# Patient Record
Sex: Female | Born: 1960 | Race: White | Hispanic: No | Marital: Single | Smoking: Never smoker
Health system: Southern US, Community
[De-identification: ages and names within clinical notes are randomized; demographics above are authoritative.]

## PROBLEM LIST (undated history)

## (undated) DIAGNOSIS — G35 Multiple sclerosis: Secondary | ICD-10-CM

## (undated) DIAGNOSIS — G35D Multiple sclerosis, unspecified: Secondary | ICD-10-CM

## (undated) DIAGNOSIS — E669 Obesity, unspecified: Secondary | ICD-10-CM

## (undated) DIAGNOSIS — M169 Osteoarthritis of hip, unspecified: Secondary | ICD-10-CM

## (undated) DIAGNOSIS — G709 Myoneural disorder, unspecified: Secondary | ICD-10-CM

## (undated) HISTORY — DX: Osteoarthritis of hip, unspecified: M16.9

## (undated) HISTORY — PX: HERNIA REPAIR: SHX51

## (undated) HISTORY — DX: Myoneural disorder, unspecified: G70.9

## (undated) HISTORY — DX: Multiple sclerosis: G35

## (undated) HISTORY — DX: Multiple sclerosis, unspecified: G35.D

## (undated) HISTORY — PX: ABDOMINAL HYSTERECTOMY: SHX81

## (undated) HISTORY — DX: Obesity, unspecified: E66.9

---

## 1996-07-31 HISTORY — PX: OVARIAN CYST REMOVAL: SHX89

## 2013-06-30 ENCOUNTER — Encounter: Payer: Self-pay | Admitting: Physician Assistant

## 2013-06-30 ENCOUNTER — Ambulatory Visit (INDEPENDENT_AMBULATORY_CARE_PROVIDER_SITE_OTHER): Payer: BC Managed Care – PPO | Admitting: Physician Assistant

## 2013-06-30 VITALS — BP 175/99 | HR 96 | Resp 16 | Ht 66.0 in | Wt 270.0 lb

## 2013-06-30 DIAGNOSIS — L720 Epidermal cyst: Secondary | ICD-10-CM

## 2013-06-30 DIAGNOSIS — M545 Low back pain: Secondary | ICD-10-CM

## 2013-06-30 DIAGNOSIS — I10 Essential (primary) hypertension: Secondary | ICD-10-CM

## 2013-06-30 DIAGNOSIS — G35 Multiple sclerosis: Secondary | ICD-10-CM | POA: Insufficient documentation

## 2013-06-30 DIAGNOSIS — IMO0001 Reserved for inherently not codable concepts without codable children: Secondary | ICD-10-CM

## 2013-06-30 DIAGNOSIS — L723 Sebaceous cyst: Secondary | ICD-10-CM

## 2013-06-30 DIAGNOSIS — R030 Elevated blood-pressure reading, without diagnosis of hypertension: Secondary | ICD-10-CM | POA: Insufficient documentation

## 2013-06-30 MED ORDER — CYCLOBENZAPRINE HCL 10 MG PO TABS: 10.0000 mg | ORAL_TABLET | Freq: Three times a day (TID) | ORAL | Status: DC | PRN

## 2013-06-30 MED ORDER — PREDNISONE 50 MG PO TABS: ORAL_TABLET | ORAL | Status: DC

## 2013-06-30 NOTE — Progress Notes (Signed)
   Subjective:    Patient ID: Sandra Hartman, female    DOB: 06/16/1961, 52 y.o.   MRN: [id number]  HPI Patient is a 52 year old female who presents to the clinic to establish care. Patient does have a past medical history of multiple sclerosis. She has previously been on medications to help with tenderness however they were expensive and really did not help. Her symptoms typically manifest with balance, tinnitus, banding, and low back pain. She's had 2 MRIs that have confirmed MS in 2008 and 2011. She regularly takes ibuprofen and a multivitamin. She denies any exercise 2 to MS. She is having an episode of low back pain exacerbation today. Per patient typically steroids usually resolve back pain. She recently moved down here approximately 2 months ago from [place]. She has done a lot of heavy lifting and more activity than she is used to. She denies any saddle anesthesia, bowel/bladder dysfunction. Per patient she has had imaging done of her back and now has been negative other than some degenerative changes.   Patient's blood pressure is very elevated today. Patient is very adamant that she has whitecoat hypertension. She checks her blood pressure at home and per patient stays around 125/85. Patient denies any chest pains, palpitations headaches or vision changes.  Patient also has a small nodule on the back of her neck. Nodule is nontender and does not seem to be growing in size. It has been present for a few months now. Patient has not done anything to make better or anything to make worse.    Review of Systems  All other systems reviewed and are negative.       Objective:   Physical Exam  Constitutional: She is oriented to person, place, and time. She appears well-developed and well-nourished.  HENT:  Head: Normocephalic and atraumatic.  Cardiovascular: Normal rate, regular rhythm and normal heart sounds.   Pulmonary/Chest: Effort normal and breath sounds normal.  Musculoskeletal:   Tightness around Paraspinous muscles of lumbar spine. Negative straight leg test. ROM normal with some discomfort at the waist.    Neurological: She is alert and oriented to person, place, and time.  Skin:  1cm by 1 cm nodule on back of neck. No erythema. Small white head   Psychiatric: She has a normal mood and affect. Her behavior is normal.          Assessment & Plan:  Elevated BP/white coat hypertension- discuss with patient that I would like for her to keep a blood pressure log at home for the next 2 weeks and bring into the office to assure me that her blood pressure is staying within normal range.  MS/The back pain flare-encouraged patient to continue taking ibuprofen as needed. I did give a five-day burst of prednisone as well as a muscle relaxer to use at bedtime to help with the back muscles. Encouraged patient to do low back stretches. Call if not improving.   Epidermal inclusion cyst- I attempted excision today but not successful I was not able to get significant pus out of sac. I stopped for today and gave pt symptomatic care. If does not resolve come back for excision or we can refer to outside dermatologist.

## 2013-06-30 NOTE — Patient Instructions (Addendum)
Low Back Strain with Rehab A strain is an injury in which a tendon or muscle is torn. The muscles and tendons of the lower back are vulnerable to strains. However, these muscles and tendons are very strong and require a great force to be injured. Strains are classified into three categories. Grade 1 strains cause pain, but the tendon is not lengthened. Grade 2 strains include a lengthened ligament, due to the ligament being stretched or partially ruptured. With grade 2 strains there is still function, although the function may be decreased. Grade 3 strains involve a complete tear of the tendon or muscle, and function is usually impaired. SYMPTOMS   Pain in the lower back.  Pain that affects one side more than the other.  Pain that gets worse with movement and may be felt in the hip, buttocks, or back of the thigh.  Muscle spasms of the muscles in the back.  Swelling along the muscles of the back.  Loss of strength of the back muscles.  Crackling sound (crepitation) when the muscles are touched. CAUSES  Lower back strains occur when a force is placed on the muscles or tendons that is greater than they can handle. Common causes of injury include:  Prolonged overuse of the muscle-tendon units in the lower back, usually from incorrect posture.  A single violent injury or force applied to the back. RISK INCREASES WITH:  Sports that involve twisting forces on the spine or a lot of bending at the waist (football, rugby, weightlifting, bowling, golf, tennis, speed skating, racquetball, swimming, running, gymnastics, diving).  Poor strength and flexibility.  Failure to warm up properly before activity.  Family history of lower back pain or disk disorders.  Previous back injury or surgery (especially fusion).  Poor posture with lifting, especially heavy objects.  Prolonged sitting, especially with poor posture. PREVENTION   Learn and use proper posture when sitting or lifting (maintain  proper posture when sitting, lift using the knees and legs, not at the waist).  Warm up and stretch properly before activity.  Allow for adequate recovery between workouts.  Maintain physical fitness:  Strength, flexibility, and endurance.  Cardiovascular fitness. PROGNOSIS  If treated properly, lower back strains usually heal within 6 weeks. RELATED COMPLICATIONS   Recurring symptoms, resulting in a chronic problem.  Chronic inflammation, scarring, and partial muscle-tendon tear.  Delayed healing or resolution of symptoms.  Prolonged disability. TREATMENT  Treatment first involves the use of ice and medicine, to reduce pain and inflammation. The use of strengthening and stretching exercises may help reduce pain with activity. These exercises may be performed at home or with a therapist. Severe injuries may require referral to a therapist for further evaluation and treatment, such as ultrasound. Your caregiver may advise that you wear a back brace or corset, to help reduce pain and discomfort. Often, prolonged bed rest results in greater harm then benefit. Corticosteroid injections may be recommended. However, these should be reserved for the most serious cases. It is important to avoid using your back when lifting objects. At night, sleep on your back on a firm mattress with a pillow placed under your knees. If non-surgical treatment is unsuccessful, surgery may be needed.  MEDICATION   If pain medicine is needed, nonsteroidal anti-inflammatory medicines (aspirin and ibuprofen), or other minor pain relievers (acetaminophen), are often advised.  Do not take pain medicine for 7 days before surgery.  Prescription pain relievers may be given, if your caregiver thinks they are needed. Use only as   directed and only as much as you need.  Ointments applied to the skin may be helpful.  Corticosteroid injections may be given by your caregiver. These injections should be reserved for the most  serious cases, because they may only be given a certain number of times. HEAT AND COLD  Cold treatment (icing) should be applied for 10 to 15 minutes every 2 to 3 hours for inflammation and pain, and immediately after activity that aggravates your symptoms. Use ice packs or an ice massage.  Heat treatment may be used before performing stretching and strengthening activities prescribed by your caregiver, physical therapist, or athletic trainer. Use a heat pack or a warm water soak. SEEK MEDICAL CARE IF:   Symptoms get worse or do not improve in 2 to 4 weeks, despite treatment.  You develop numbness, weakness, or loss of bowel or bladder function.  New, unexplained symptoms develop. (Drugs used in treatment may produce side effects.) EXERCISES  RANGE OF MOTION (ROM) AND STRETCHING EXERCISES - Low Back Strain Most people with lower back pain will find that their symptoms get worse with excessive bending forward (flexion) or arching at the lower back (extension). The exercises which will help resolve your symptoms will focus on the opposite motion.  Your physician, physical therapist or athletic trainer will help you determine which exercises will be most helpful to resolve your lower back pain. Do not complete any exercises without first consulting with your caregiver. Discontinue any exercises which make your symptoms worse until you speak to your caregiver.  If you have pain, numbness or tingling which travels down into your buttocks, leg or foot, the goal of the therapy is for these symptoms to move closer to your back and eventually resolve. Sometimes, these leg symptoms will get better, but your lower back pain may worsen. This is typically an indication of progress in your rehabilitation. Be very alert to any changes in your symptoms and the activities in which you participated in the 24 hours prior to the change. Sharing this information with your caregiver will allow him/her to most efficiently  treat your condition.  These exercises may help you when beginning to rehabilitate your injury. Your symptoms may resolve with or without further involvement from your physician, physical therapist or athletic trainer. While completing these exercises, remember:  Restoring tissue flexibility helps normal motion to return to the joints. This allows healthier, less painful movement and activity.  An effective stretch should be held for at least 30 seconds.  A stretch should never be painful. You should only feel a gentle lengthening or release in the stretched tissue. FLEXION RANGE OF MOTION AND STRETCHING EXERCISES: STRETCH  Flexion, Single Knee to Chest   Lie on a firm bed or floor with both legs extended in front of you.  Keeping one leg in contact with the floor, bring your opposite knee to your chest. Hold your leg in place by either grabbing behind your thigh or at your knee.  Pull until you feel a gentle stretch in your lower back. Hold __________ seconds.  Slowly release your grasp and repeat the exercise with the opposite side. Repeat __________ times. Complete this exercise __________ times per day.  STRETCH  Flexion, Double Knee to Chest   Lie on a firm bed or floor with both legs extended in front of you.  Keeping one leg in contact with the floor, bring your opposite knee to your chest.  Tense your stomach muscles to support your back and then   lift your other knee to your chest. Hold your legs in place by either grabbing behind your thighs or at your knees.  Pull both knees toward your chest until you feel a gentle stretch in your lower back. Hold __________ seconds.  Tense your stomach muscles and slowly return one leg at a time to the floor. Repeat __________ times. Complete this exercise __________ times per day.  STRETCH  Low Trunk Rotation  Lie on a firm bed or floor. Keeping your legs in front of you, bend your knees so they are both pointed toward the ceiling and  your feet are flat on the floor.  Extend your arms out to the side. This will stabilize your upper body by keeping your shoulders in contact with the floor.  Gently and slowly drop both knees together to one side until you feel a gentle stretch in your lower back. Hold for __________ seconds.  Tense your stomach muscles to support your lower back as you bring your knees back to the starting position. Repeat the exercise to the other side. Repeat __________ times. Complete this exercise __________ times per day  EXTENSION RANGE OF MOTION AND FLEXIBILITY EXERCISES: STRETCH  Extension, Prone on Elbows   Lie on your stomach on the floor, a bed will be too soft. Place your palms about shoulder width apart and at the height of your head.  Place your elbows under your shoulders. If this is too painful, stack pillows under your chest.  Allow your body to relax so that your hips drop lower and make contact more completely with the floor.  Hold this position for __________ seconds.  Slowly return to lying flat on the floor. Repeat __________ times. Complete this exercise __________ times per day.  RANGE OF MOTION  Extension, Prone Press Ups  Lie on your stomach on the floor, a bed will be too soft. Place your palms about shoulder width apart and at the height of your head.  Keeping your back as relaxed as possible, slowly straighten your elbows while keeping your hips on the floor. You may adjust the placement of your hands to maximize your comfort. As you gain motion, your hands will come more underneath your shoulders.  Hold this position __________ seconds.  Slowly return to lying flat on the floor. Repeat __________ times. Complete this exercise __________ times per day.  RANGE OF MOTION- Quadruped, Neutral Spine   Assume a hands and knees position on a firm surface. Keep your hands under your shoulders and your knees under your hips. You may place padding under your knees for  comfort.  Drop your head and point your tail bone toward the ground below you. This will round out your lower back like an angry cat. Hold this position for __________ seconds.  Slowly lift your head and release your tail bone so that your back sags into a large arch, like an old horse.  Hold this position for __________ seconds.  Repeat this until you feel limber in your lower back.  Now, find your "sweet spot." This will be the most comfortable position somewhere between the two previous positions. This is your neutral spine. Once you have found this position, tense your stomach muscles to support your lower back.  Hold this position for __________ seconds. Repeat __________ times. Complete this exercise __________ times per day.  STRENGTHENING EXERCISES - Low Back Strain These exercises may help you when beginning to rehabilitate your injury. These exercises should be done near your "sweet   spot." This is the neutral, low-back arch, somewhere between fully rounded and fully arched, that is your least painful position. When performed in this safe range of motion, these exercises can be used for people who have either a flexion or extension based injury. These exercises may resolve your symptoms with or without further involvement from your physician, physical therapist or athletic trainer. While completing these exercises, remember:   Muscles can gain both the endurance and the strength needed for everyday activities through controlled exercises.  Complete these exercises as instructed by your physician, physical therapist or athletic trainer. Increase the resistance and repetitions only as guided.  You may experience muscle soreness or fatigue, but the pain or discomfort you are trying to eliminate should never worsen during these exercises. If this pain does worsen, stop and make certain you are following the directions exactly. If the pain is still present after adjustments, discontinue the  exercise until you can discuss the trouble with your caregiver. STRENGTHENING Deep Abdominals, Pelvic Tilt  Lie on a firm bed or floor. Keeping your legs in front of you, bend your knees so they are both pointed toward the ceiling and your feet are flat on the floor.  Tense your lower abdominal muscles to press your lower back into the floor. This motion will rotate your pelvis so that your tail bone is scooping upwards rather than pointing at your feet or into the floor.  With a gentle tension and even breathing, hold this position for __________ seconds. Repeat __________ times. Complete this exercise __________ times per day.  STRENGTHENING  Abdominals, Crunches   Lie on a firm bed or floor. Keeping your legs in front of you, bend your knees so they are both pointed toward the ceiling and your feet are flat on the floor. Cross your arms over your chest.  Slightly tip your chin down without bending your neck.  Tense your abdominals and slowly lift your trunk high enough to just clear your shoulder blades. Lifting higher can put excessive stress on the lower back and does not further strengthen your abdominal muscles.  Control your return to the starting position. Repeat __________ times. Complete this exercise __________ times per day.  STRENGTHENING  Quadruped, Opposite UE/LE Lift   Assume a hands and knees position on a firm surface. Keep your hands under your shoulders and your knees under your hips. You may place padding under your knees for comfort.  Find your neutral spine and gently tense your abdominal muscles so that you can maintain this position. Your shoulders and hips should form a rectangle that is parallel with the floor and is not twisted.  Keeping your trunk steady, lift your right hand no higher than your shoulder and then your left leg no higher than your hip. Make sure you are not holding your breath. Hold this position __________ seconds.  Continuing to keep your  abdominal muscles tense and your back steady, slowly return to your starting position. Repeat with the opposite arm and leg. Repeat __________ times. Complete this exercise __________ times per day.  STRENGTHENING  Lower Abdominals, Double Knee Lift  Lie on a firm bed or floor. Keeping your legs in front of you, bend your knees so they are both pointed toward the ceiling and your feet are flat on the floor.  Tense your abdominal muscles to brace your lower back and slowly lift both of your knees until they come over your hips. Be certain not to hold your breath.    Hold __________ seconds. Using your abdominal muscles, return to the starting position in a slow and controlled manner. Repeat __________ times. Complete this exercise __________ times per day.  POSTURE AND BODY MECHANICS CONSIDERATIONS - Low Back Strain Keeping correct posture when sitting, standing or completing your activities will reduce the stress put on different body tissues, allowing injured tissues a chance to heal and limiting painful experiences. The following are general guidelines for improved posture. Your physician or physical therapist will provide you with any instructions specific to your needs. While reading these guidelines, remember:  The exercises prescribed by your provider will help you have the flexibility and strength to maintain correct postures.  The correct posture provides the best environment for your joints to work. All of your joints have less wear and tear when properly supported by a spine with good posture. This means you will experience a healthier, less painful body.  Correct posture must be practiced with all of your activities, especially prolonged sitting and standing. Correct posture is as important when doing repetitive low-stress activities (typing) as it is when doing a single heavy-load activity (lifting). RESTING POSITIONS Consider which positions are most painful for you when choosing a  resting position. If you have pain with flexion-based activities (sitting, bending, stooping, squatting), choose a position that allows you to rest in a less flexed posture. You would want to avoid curling into a fetal position on your side. If your pain worsens with extension-based activities (prolonged standing, working overhead), avoid resting in an extended position such as sleeping on your stomach. Most people will find more comfort when they rest with their spine in a more neutral position, neither too rounded nor too arched. Lying on a non-sagging bed on your side with a pillow between your knees, or on your back with a pillow under your knees will often provide some relief. Keep in mind, being in any one position for a prolonged period of time, no matter how correct your posture, can still lead to stiffness. PROPER SITTING POSTURE In order to minimize stress and discomfort on your spine, you must sit with correct posture. Sitting with good posture should be effortless for a healthy body. Returning to good posture is a gradual process. Many people can work toward this most comfortably by using various supports until they have the flexibility and strength to maintain this posture on their own. When sitting with proper posture, your ears will fall over your shoulders and your shoulders will fall over your hips. You should use the back of the chair to support your upper back. Your lower back will be in a neutral position, just slightly arched. You may place a small pillow or folded towel at the base of your lower back for support.  When working at a desk, create an environment that supports good, upright posture. Without extra support, muscles tire, which leads to excessive strain on joints and other tissues. Keep these recommendations in mind: CHAIR:  A chair should be able to slide under your desk when your back makes contact with the back of the chair. This allows you to work closely.  The chair's  height should allow your eyes to be level with the upper part of your monitor and your hands to be slightly lower than your elbows. BODY POSITION  Your feet should make contact with the floor. If this is not possible, use a foot rest.  Keep your ears over your shoulders. This will reduce stress on your neck and   lower back. INCORRECT SITTING POSTURES  If you are feeling tired and unable to assume a healthy sitting posture, do not slouch or slump. This puts excessive strain on your back tissues, causing more damage and pain. Healthier options include:  Using more support, like a lumbar pillow.  Switching tasks to something that requires you to be upright or walking.  Talking a brief walk.  Lying down to rest in a neutral-spine position. PROLONGED STANDING WHILE SLIGHTLY LEANING FORWARD  When completing a task that requires you to lean forward while standing in one place for a long time, place either foot up on a stationary 2-4 inch high object to help maintain the best posture. When both feet are on the ground, the lower back tends to lose its slight inward curve. If this curve flattens (or becomes too large), then the back and your other joints will experience too much stress, tire more quickly, and can cause pain. CORRECT STANDING POSTURES Proper standing posture should be assumed with all daily activities, even if they only take a few moments, like when brushing your teeth. As in sitting, your ears should fall over your shoulders and your shoulders should fall over your hips. You should keep a slight tension in your abdominal muscles to brace your spine. Your tailbone should point down to the ground, not behind your body, resulting in an over-extended swayback posture.  INCORRECT STANDING POSTURES  Common incorrect standing postures include a forward head, locked knees and/or an excessive swayback. WALKING Walk with an upright posture. Your ears, shoulders and hips should all  line-up. PROLONGED ACTIVITY IN A FLEXED POSITION When completing a task that requires you to bend forward at your waist or lean over a low surface, try to find a way to stabilize 3 out of 4 of your limbs. You can place a hand or elbow on your thigh or rest a knee on the surface you are reaching across. This will provide you more stability so that your muscles do not fatigue as quickly. By keeping your knees relaxed, or slightly bent, you will also reduce stress across your lower back. CORRECT LIFTING TECHNIQUES DO :   Assume a wide stance. This will provide you more stability and the opportunity to get as close as possible to the object which you are lifting.  Tense your abdominals to brace your spine. Bend at the knees and hips. Keeping your back locked in a neutral-spine position, lift using your leg muscles. Lift with your legs, keeping your back straight.  Test the weight of unknown objects before attempting to lift them.  Try to keep your elbows locked down at your sides in order get the best strength from your shoulders when carrying an object.  Always ask for help when lifting heavy or awkward objects. INCORRECT LIFTING TECHNIQUES DO NOT:   Lock your knees when lifting, even if it is a small object.  Bend and twist. Pivot at your feet or move your feet when needing to change directions.  Assume that you can safely pick up even a paper clip without proper posture. Document Released: 07/17/2005 Document Revised: 10/09/2011 Document Reviewed: 10/29/2008 [facility] [place] Patient Information 2014 [place], [place].   Epidermal Cyst An epidermal cyst is sometimes called a sebaceous cyst, epidermal inclusion cyst, or infundibular cyst. These cysts usually contain a substance that looks "pasty" or "cheesy" and may have a bad smell. This substance is a protein called keratin. Epidermal cysts are usually found on the face, neck, or trunk. They  may also occur in the vaginal area or other parts of the  genitalia of both men and women. Epidermal cysts are usually small, painless, slow-growing bumps or lumps that move freely under the skin. It is important not to try to pop them. This may cause an infection and lead to tenderness and swelling. CAUSES  Epidermal cysts may be caused by a deep penetrating injury to the skin or a plugged hair follicle, often associated with acne. SYMPTOMS  Epidermal cysts can become inflamed and cause:  Redness.  Tenderness.  Increased temperature of the skin over the bumps or lumps.  Grayish-white, bad smelling material that drains from the bump or lump. DIAGNOSIS  Epidermal cysts are easily diagnosed by your caregiver during an exam. Rarely, a tissue sample (biopsy) may be taken to rule out other conditions that may resemble epidermal cysts. TREATMENT   Epidermal cysts often get better and disappear on their own. They are rarely ever cancerous.  If a cyst becomes infected, it may become inflamed and tender. This may require opening and draining the cyst. Treatment with antibiotics may be necessary. When the infection is gone, the cyst may be removed with minor surgery.  Small, inflamed cysts can often be treated with antibiotics or by injecting steroid medicines.  Sometimes, epidermal cysts become large and bothersome. If this happens, surgical removal in your caregiver's office may be necessary. HOME CARE INSTRUCTIONS  Only take over-the-counter or prescription medicines as directed by your caregiver.  Take your antibiotics as directed. Finish them even if you start to feel better. SEEK MEDICAL CARE IF:   Your cyst becomes tender, red, or swollen.  Your condition is not improving or is getting worse.  You have any other questions or concerns. MAKE SURE YOU:  Understand these instructions.  Will watch your condition.  Will get help right away if you are not doing well or get worse. Document Released: 06/17/2004 Document Revised: 10/09/2011  Document Reviewed: 01/23/2011 [facility] Patient Information 2014 [place], [place].

## 2013-07-03 ENCOUNTER — Encounter: Payer: Self-pay | Admitting: Obstetrics & Gynecology

## 2013-07-10 ENCOUNTER — Encounter: Payer: Self-pay | Admitting: Obstetrics & Gynecology

## 2013-07-10 ENCOUNTER — Ambulatory Visit (INDEPENDENT_AMBULATORY_CARE_PROVIDER_SITE_OTHER): Payer: BC Managed Care – PPO

## 2013-07-10 ENCOUNTER — Ambulatory Visit (INDEPENDENT_AMBULATORY_CARE_PROVIDER_SITE_OTHER): Payer: BC Managed Care – PPO | Admitting: Obstetrics & Gynecology

## 2013-07-10 VITALS — BP 161/106 | HR 127 | Resp 18 | Ht 66.0 in | Wt 267.0 lb

## 2013-07-10 DIAGNOSIS — Z01419 Encounter for gynecological examination (general) (routine) without abnormal findings: Secondary | ICD-10-CM

## 2013-07-10 DIAGNOSIS — N949 Unspecified condition associated with female genital organs and menstrual cycle: Secondary | ICD-10-CM

## 2013-07-10 DIAGNOSIS — Z124 Encounter for screening for malignant neoplasm of cervix: Secondary | ICD-10-CM

## 2013-07-10 DIAGNOSIS — Z0000 Encounter for general adult medical examination without abnormal findings: Secondary | ICD-10-CM

## 2013-07-10 DIAGNOSIS — Z1151 Encounter for screening for human papillomavirus (HPV): Secondary | ICD-10-CM

## 2013-07-10 DIAGNOSIS — Z1231 Encounter for screening mammogram for malignant neoplasm of breast: Secondary | ICD-10-CM

## 2013-07-10 DIAGNOSIS — N898 Other specified noninflammatory disorders of vagina: Secondary | ICD-10-CM

## 2013-07-10 NOTE — Progress Notes (Signed)
Subjective:    Sandra Hartman is a 51 y.o. female who presents for an annual exam. The patient is interested in weight loss. She needs a pap/mammogram. She mentions a constant vaginal discharge (clear) with an unusual/different odor. The patient is not currently sexually active. GYN screening history: last pap: was normal. The patient wears seatbelts: yes. The patient participates in regular exercise: no. Has the patient ever been transfused or tattooed?: no. The patient reports that there is not domestic violence in her life.   Menstrual History: OB History   Grav Para Term Preterm Abortions TAB SAB Ect Mult Living   0 0 0 0 0 0 0 0 0 0       Menarche age: 76 [place]: 48 LMP: 3/14 (:))  Patient's last menstrual period was 10/25/2012.    The following portions of the patient's history were reviewed and updated as appropriate: allergies, current medications, past family history, past medical history, past social history, past surgical history and problem list.  Review of Systems A comprehensive review of systems was negative. Single forever. Abstinent for years. Works for [employer], Research scientist (medical). Declines flu vaccine. She had fasting labs at work and cholesterol was 280. She gained 100 # after being diagnosed with MS in 2/09. She says that her sister is a Engineer, civil (consulting) and checks her BP at home and it is always normal.  BUT she also has elevated BP at doctor's offices.   Objective:    BP 161/106  Pulse 127  Resp 18  Ht 5\' 6"  (1.676 m)  Wt 267 lb (121.11 kg)  BMI 43.12 kg/m2  LMP 10/25/2012  General Appearance:    Alert, cooperative, no distress, appears stated age  Head:    Normocephalic, without obvious abnormality, atraumatic  Eyes:    PERRL, conjunctiva/corneas clear, EOM's intact, fundi    benign, both eyes  Ears:    Normal TM's and external ear canals, both ears  Nose:   Nares normal, septum midline, mucosa normal, no drainage    or sinus tenderness  Throat:   Lips,  mucosa, and tongue normal; teeth and gums normal  Neck:   Supple, symmetrical, trachea midline, no adenopathy;    thyroid:  no enlargement/tenderness/nodules; no carotid   bruit or JVD  Back:     Symmetric, no curvature, ROM normal, no CVA tenderness  Lungs:     Clear to auscultation bilaterally, respirations unlabored  Chest Wall:    No tenderness or deformity   Heart:    Regular rate and rhythm, S1 and S2 normal, no murmur, rub   or gallop  Breast Exam:    No tenderness, masses, or nipple abnormality  Abdomen:     Soft, non-tender, bowel sounds active all four quadrants,    no masses, no organomegaly  Genitalia:    Normal female without lesion, discharge or tenderness, no odor, scant clear discharge of vagina, NSSR, NT, mobile, no adnexal masses     Extremities:   Extremities normal, atraumatic, no cyanosis or edema  Pulses:   2+ and symmetric all extremities  Skin:   Skin color, texture, turgor normal, no rashes or lesions  Lymph nodes:   Cervical, supraclavicular, and axillary nodes normal  Neurologic:   CNII-XII intact, normal strength, sensation and reflexes    throughout  .    Assessment:    Healthy female exam.    Plan:     Breast self exam technique reviewed and patient encouraged to perform self-exam monthly. Mammogram. Thin prep Pap  smear. Wet prep.  costesting Referral to bariatric center

## 2013-07-10 NOTE — Patient Instructions (Signed)

## 2013-07-11 LAB — WET PREP, GENITAL: Yeast Wet Prep HPF POC: NONE SEEN

## 2013-08-12 ENCOUNTER — Encounter: Payer: BC Managed Care – PPO | Admitting: Obstetrics & Gynecology

## 2013-10-03 ENCOUNTER — Ambulatory Visit (INDEPENDENT_AMBULATORY_CARE_PROVIDER_SITE_OTHER): Payer: BC Managed Care – PPO | Admitting: Physician Assistant

## 2013-10-03 ENCOUNTER — Encounter: Payer: Self-pay | Admitting: Physician Assistant

## 2013-10-03 VITALS — BP 175/91 | HR 107 | Temp 97.6°F | Wt 266.0 lb

## 2013-10-03 DIAGNOSIS — I10 Essential (primary) hypertension: Secondary | ICD-10-CM

## 2013-10-03 DIAGNOSIS — IMO0001 Reserved for inherently not codable concepts without codable children: Secondary | ICD-10-CM

## 2013-10-03 DIAGNOSIS — H8110 Benign paroxysmal vertigo, unspecified ear: Secondary | ICD-10-CM

## 2013-10-03 DIAGNOSIS — R030 Elevated blood-pressure reading, without diagnosis of hypertension: Secondary | ICD-10-CM

## 2013-10-03 DIAGNOSIS — G35 Multiple sclerosis: Secondary | ICD-10-CM

## 2013-10-03 DIAGNOSIS — R51 Headache: Secondary | ICD-10-CM

## 2013-10-03 MED ORDER — FLUTICASONE PROPIONATE 50 MCG/ACT NA SUSP: 2.0000 | Freq: Every day | NASAL | Status: DC

## 2013-10-03 MED ORDER — KETOROLAC TROMETHAMINE 30 MG/ML IJ SOLN
30.0000 mg | Freq: Once | INTRAMUSCULAR | Status: AC
  Administered 2013-10-03: 30 mg via INTRAMUSCULAR

## 2013-10-03 MED ORDER — MECLIZINE HCL 25 MG PO TABS: 25.0000 mg | ORAL_TABLET | Freq: Three times a day (TID) | ORAL | Status: DC | PRN

## 2013-10-03 NOTE — Patient Instructions (Signed)
1-2 months BP log.

## 2013-10-03 NOTE — Progress Notes (Signed)
   Subjective:    Patient ID: Sandra Hartman, female    DOB: 1961/07/18, 53 y.o.   MRN: [id number]  HPI Pt is a 52 yo female who presents to the clinic with headache and dizzines since Monday almost 5 days. She woke up Monday morning and felt like room was spinning but seemed to get better through Tuesday. Headache is off and on and present behind eyes. Denies any vision changes or episodes of blindness. She denies any ear pain or sinus congestion/pressure. No fever, ST, cough, wheezing. She does ache between her shoulder blades. She feels naseated but no vomiting. She has tried ibuprofen and muscle relaxer's which have not helped very much. Symptoms are worse with movement and better with lying still.    Review of Systems     Objective:   Physical Exam  Constitutional: She appears well-developed and well-nourished.  HENT:  Head: Normocephalic and atraumatic.  Right Ear: External ear normal.  Left Ear: External ear normal.  Nose: Nose normal.  Mouth/Throat: Oropharynx is clear and moist.  TM's injected bilaterally.   Eyes: Conjunctivae and EOM are normal. Pupils are equal, round, and reactive to light. Right eye exhibits no discharge. Left eye exhibits no discharge.  Neck: Normal range of motion. Neck supple.  Cardiovascular: Regular rhythm and normal heart sounds.   Tachycardia at 107.   Pulmonary/Chest: Effort normal and breath sounds normal. She has no wheezes.  Lymphadenopathy:    She has no cervical adenopathy.  Neurological:  Positive Dix Hallipike to the the right.   Skin: Skin is dry.  Psychiatric: She has a normal mood and affect. Her behavior is normal.          Assessment & Plan:  BPV/headache-HA could be a MS reaction or from BPV. treated with in office shot of toradol 30mg  for headache. Gave epley manuevers explained how to do. Antivert was given for nausea and dizziness. Flonase to help nasal congestion and perhaps allow any fluid on TM's to drain. Continue to use  ibuprofen and flexeril. Call if worsening or not improving.   MS/Elevated BP/White coat HTN- pt always has high reading when she comes into office. This worries me because we don't know what she is running on a daily basis. Pt was instructed to keep log for 2 weeks up to three BP readings a day. i want to sit down and compare BP readings and make sure we don't need to start treating BP.

## 2014-04-07 ENCOUNTER — Encounter: Payer: BC Managed Care – PPO | Admitting: Physician Assistant

## 2014-04-08 ENCOUNTER — Ambulatory Visit (INDEPENDENT_AMBULATORY_CARE_PROVIDER_SITE_OTHER): Payer: BC Managed Care – PPO | Admitting: Physician Assistant

## 2014-04-08 ENCOUNTER — Encounter: Payer: Self-pay | Admitting: Physician Assistant

## 2014-04-08 VITALS — BP 190/94 | HR 88 | Ht 66.0 in | Wt 266.0 lb

## 2014-04-08 DIAGNOSIS — G35D Multiple sclerosis, unspecified: Secondary | ICD-10-CM | POA: Diagnosis not present

## 2014-04-08 DIAGNOSIS — Z0000 Encounter for general adult medical examination without abnormal findings: Secondary | ICD-10-CM

## 2014-04-08 DIAGNOSIS — G35 Multiple sclerosis: Secondary | ICD-10-CM | POA: Diagnosis not present

## 2014-04-08 DIAGNOSIS — IMO0001 Reserved for inherently not codable concepts without codable children: Secondary | ICD-10-CM

## 2014-04-08 DIAGNOSIS — R030 Elevated blood-pressure reading, without diagnosis of hypertension: Secondary | ICD-10-CM

## 2014-04-08 MED ORDER — CYCLOBENZAPRINE HCL 10 MG PO TABS: 10.0000 mg | ORAL_TABLET | Freq: Three times a day (TID) | ORAL | Status: DC | PRN

## 2014-04-08 MED ORDER — MELOXICAM 15 MG PO TABS: 15.0000 mg | ORAL_TABLET | Freq: Every day | ORAL | Status: DC

## 2014-04-08 MED ORDER — TRAMADOL HCL 50 MG PO TABS: 50.0000 mg | ORAL_TABLET | Freq: Two times a day (BID) | ORAL | Status: DC | PRN

## 2014-04-08 NOTE — Patient Instructions (Signed)

## 2014-04-11 NOTE — Progress Notes (Signed)
  Subjective:     Sandra Hartman is a 53 y.o. female and is here for a comprehensive physical exam. The patient reports no problems.  MS- fairly controlled taking ibuprofen daily. Would like something to help with breakthrough pain. Ibuprofen works fairly well.   History   Social History  . Marital Status: Married    Spouse Name: N/A    Number of Children: N/A  . Years of Education: N/A   Occupational History  . Not on file.   Social History Main Topics  . Smoking status: Never Smoker   . Smokeless tobacco: Never Used  . Alcohol Use: No  . Drug Use: No  . Sexual Activity: Not Currently   Other Topics Concern  . Not on file   Social History Narrative  . No narrative on file   Health Maintenance  Topic Date Due  . Tetanus/tdap  01/09/1979  . Colonoscopy  01/08/2010  . Influenza Vaccine  04/09/2015 (Originally 02/28/2014)  . Mammogram  07/11/2015  . Pap Smear  07/10/2016    The following portions of the patient's history were reviewed and updated as appropriate: allergies, current medications, past family history, past medical history, past social history, past surgical history and problem list.  Review of Systems A comprehensive review of systems was negative.   Objective:    BP 190/94  Pulse 88  Ht 5\' 6"  (1.676 m)  Wt 266 lb (120.657 kg)  BMI 42.95 kg/m2 General appearance: alert, cooperative and moderately obese Head: Normocephalic, without obvious abnormality, atraumatic Eyes: conjunctivae/corneas clear. PERRL, EOM's intact. Fundi benign. Ears: normal TM's and external ear canals both ears Nose: Nares normal. Septum midline. Mucosa normal. No drainage or sinus tenderness. Throat: lips, mucosa, and tongue normal; teeth and gums normal Neck: no adenopathy, no carotid bruit, no JVD, supple, symmetrical, trachea midline and thyroid not enlarged, symmetric, no tenderness/mass/nodules Back: symmetric, no curvature. ROM normal. No CVA tenderness. Lungs: clear to  auscultation bilaterally Breasts: normal appearance, no masses or tenderness Heart: regular rate and rhythm, S1, S2 normal, no murmur, click, rub or gallop Abdomen: soft, non-tender; bowel sounds normal; no masses,  no organomegaly Pelvic: cervix normal in appearance, external genitalia normal, no adnexal masses or tenderness, no cervical motion tenderness, uterus normal size, shape, and consistency and vagina normal without discharge Extremities: extremities normal, atraumatic, no cyanosis or edema Pulses: 2+ and symmetric Skin: Skin color, texture, turgor normal. No rashes or lesions Lymph nodes: Cervical, supraclavicular, and axillary nodes normal. Neurologic: Grossly normal    Assessment:    Healthy female exam.       Plan:    CPE- labs done at work. Will fax copy. All within normal limits. Declined colonoscopy. Due for mammogram and scheduled. Declined tetanus. HO given. Encouraged regular exercise and to work on weight loss.   White coat hypertension- bP taken at work and was 130/80 and passed qualifications. Always high at doctors office. Denies any HA's, CP, palpitations.   MS- taking a lot of motrin. Switched to mobic to try. Discussed GI side effects. Tramadol as needed given for acute pain.  See After Visit Summary for Counseling Recommendations

## 2014-07-08 ENCOUNTER — Other Ambulatory Visit: Payer: Self-pay | Admitting: Physician Assistant

## 2014-07-08 ENCOUNTER — Encounter: Payer: Self-pay | Admitting: Physician Assistant

## 2014-07-08 MED ORDER — PREDNISONE 50 MG PO TABS: ORAL_TABLET | ORAL | Status: DC

## 2014-09-02 ENCOUNTER — Encounter: Payer: Self-pay | Admitting: Physician Assistant

## 2014-09-02 ENCOUNTER — Ambulatory Visit (INDEPENDENT_AMBULATORY_CARE_PROVIDER_SITE_OTHER): Payer: BLUE CROSS/BLUE SHIELD | Admitting: Physician Assistant

## 2014-09-02 VITALS — BP 175/91 | HR 100 | Wt 273.0 lb

## 2014-09-02 DIAGNOSIS — F411 Generalized anxiety disorder: Secondary | ICD-10-CM | POA: Insufficient documentation

## 2014-09-02 DIAGNOSIS — R253 Fasciculation: Secondary | ICD-10-CM

## 2014-09-02 DIAGNOSIS — G35 Multiple sclerosis: Secondary | ICD-10-CM

## 2014-09-02 DIAGNOSIS — R0789 Other chest pain: Secondary | ICD-10-CM

## 2014-09-02 MED ORDER — LORAZEPAM 0.5 MG PO TABS: 0.5000 mg | ORAL_TABLET | Freq: Two times a day (BID) | ORAL | Status: DC | PRN

## 2014-09-02 NOTE — Progress Notes (Signed)
   Subjective:    Patient ID: Sandra Hartman, female    DOB: Feb 27, 1961, 54 y.o.   MRN: [id number]  HPI  Pt presents to the clinic with atypical squeezing pain of chest and "fluttering feeling of epigastric area". She had ongoing "banding that extends from ribcage down through abdomen" that she has had since dx of MS. What is new is fluttering and causes her to feel very anxious. Denies any CP, sweating, pain after eating, acid reflux, tingling down arms, back pain. No changes in bowel movements. When chest flutters feels like she has to cough. BP running 125/82 at home. Motrin does help. Has anxiety but not treating. Not tried anything else to make better.    Review of Systems  All other systems reviewed and are negative.      Objective:   Physical Exam  Constitutional: She is oriented to person, place, and time. She appears well-developed and well-nourished.  HENT:  Head: Normocephalic and atraumatic.  Cardiovascular: Normal rate, regular rhythm and normal heart sounds.   Pulmonary/Chest: Effort normal and breath sounds normal. She has no wheezes.  Abdominal: Soft. Bowel sounds are normal. She exhibits no distension and no mass. There is no rebound and no guarding.  Tenderness to deep palpation over bottom of sternum.   Neurological: She is alert and oriented to person, place, and time.  Skin: Skin is dry.  Psychiatric: She has a normal mood and affect. Her behavior is normal.          Assessment & Plan:  White coat hypertension- per pt checking at home and running 125/82. She is always nervous when she comes here.   Pressure/banding pain/muscle fluttering-  EKG NSR at 83, no ST elevation or depression, no PVCs or arrhymias.  Will order stress test. Sounds like combination of anxiety and MS flare could be a possiblity of PVC but not showing up on EKG. Pt reports these are similar symptoms she had when diagnosed with MS except for fluttering.  Did give flexeril and mobic to try.   Can also use ativian for fluttering muscle and as anxiety increases.  Red flag symptoms were discussed.

## 2014-09-02 NOTE — Patient Instructions (Addendum)
Will order stress test.  Gave pt some flexeril to use as needed.  Also given some ativan to try to see if can help with muscle relaxation as well as anxiety.

## 2014-09-06 DIAGNOSIS — R0789 Other chest pain: Secondary | ICD-10-CM | POA: Insufficient documentation

## 2014-09-15 ENCOUNTER — Ambulatory Visit: Payer: BLUE CROSS/BLUE SHIELD | Admitting: Obstetrics & Gynecology

## 2014-09-23 NOTE — Addendum Note (Signed)
Addended by: Narda Rutherford on: 09/23/2014 07:24 AM   Modules accepted: Orders

## 2014-09-24 ENCOUNTER — Other Ambulatory Visit: Payer: Self-pay | Admitting: Obstetrics & Gynecology

## 2014-09-24 DIAGNOSIS — Z139 Encounter for screening, unspecified: Secondary | ICD-10-CM

## 2014-10-21 ENCOUNTER — Encounter: Payer: Self-pay | Admitting: Obstetrics & Gynecology

## 2014-10-21 ENCOUNTER — Ambulatory Visit (INDEPENDENT_AMBULATORY_CARE_PROVIDER_SITE_OTHER): Payer: BLUE CROSS/BLUE SHIELD

## 2014-10-21 ENCOUNTER — Ambulatory Visit (INDEPENDENT_AMBULATORY_CARE_PROVIDER_SITE_OTHER): Payer: BLUE CROSS/BLUE SHIELD | Admitting: Obstetrics & Gynecology

## 2014-10-21 VITALS — BP 165/90 | HR 96 | Resp 16 | Ht 66.0 in | Wt 270.0 lb

## 2014-10-21 DIAGNOSIS — Z1151 Encounter for screening for human papillomavirus (HPV): Secondary | ICD-10-CM | POA: Diagnosis not present

## 2014-10-21 DIAGNOSIS — Z01419 Encounter for gynecological examination (general) (routine) without abnormal findings: Secondary | ICD-10-CM

## 2014-10-21 DIAGNOSIS — Z124 Encounter for screening for malignant neoplasm of cervix: Secondary | ICD-10-CM

## 2014-10-21 DIAGNOSIS — E669 Obesity, unspecified: Secondary | ICD-10-CM | POA: Insufficient documentation

## 2014-10-21 DIAGNOSIS — Z1231 Encounter for screening mammogram for malignant neoplasm of breast: Secondary | ICD-10-CM | POA: Diagnosis not present

## 2014-10-21 DIAGNOSIS — Z139 Encounter for screening, unspecified: Secondary | ICD-10-CM

## 2014-10-21 DIAGNOSIS — Z0000 Encounter for general adult medical examination without abnormal findings: Secondary | ICD-10-CM

## 2014-10-21 NOTE — Progress Notes (Signed)
Subjective:    Sandra Hartman is a 54 y.o. SW P0 female who presents for an annual exam. The patient has no complaints today. She had severe pain on the right side a couple of weeks ago. It resolved. She had a similar pain in the past with ovarian cysts. The patient is not currently sexually active. GYN screening history: last pap: was normal. The patient wears seatbelts: yes. The patient participates in regular exercise: "sometimes". Has the patient ever been transfused or tattooed?: no. The patient reports that there is not domestic violence in her life.   Menstrual History: OB History    Gravida Para Term Preterm AB TAB SAB Ectopic Multiple Living   0 0 0 0 0 0 0 0 0 0       Menarche age: 41  Patient's last menstrual period was 10/25/2012.    The following portions of the patient's history were reviewed and updated as appropriate: allergies, current medications, past family history, past medical history, past social history, past surgical history and problem list.  Review of Systems A comprehensive review of systems was negative. Working at [employer]. Declines flu vaccine. Mammogram today. LMP about 09/2012. She is aware that colonoscopy is due.   Objective:    BP 165/90 mmHg  Pulse 96  Resp 16  Ht 5\' 6"  (1.676 m)  Wt 270 lb (122.471 kg)  BMI 43.60 kg/m2  LMP 10/25/2012  General Appearance:    Alert, cooperative, no distress, appears stated age  Head:    Normocephalic, without obvious abnormality, atraumatic  Eyes:    PERRL, conjunctiva/corneas clear, EOM's intact, fundi    benign, both eyes  Ears:    Normal TM's and external ear canals, both ears  Nose:   Nares normal, septum midline, mucosa normal, no drainage    or sinus tenderness  Throat:   Lips, mucosa, and tongue normal; teeth and gums normal  Neck:   Supple, symmetrical, trachea midline, no adenopathy;    thyroid:  no enlargement/tenderness/nodules; no carotid   bruit or JVD  Back:      Symmetric, no curvature, ROM normal, no CVA tenderness  Lungs:     Clear to auscultation bilaterally, respirations unlabored  Chest Wall:    No tenderness or deformity   Heart:    Regular rate and rhythm, S1 and S2 normal, no murmur, rub   or gallop  Breast Exam:    No tenderness, masses, or nipple abnormality  Abdomen:     Soft, non-tender, bowel sounds active all four quadrants,    no masses, no organomegaly, obese  Genitalia:    Normal female without lesion, discharge or tenderness, paucity of pubic hair, ULN size uterus, mobile, NT, no pelvic masses felt     Extremities:   Extremities normal, atraumatic, no cyanosis or edema  Pulses:   2+ and symmetric all extremities  Skin:   Skin color, texture, turgor normal, no rashes or lesions  Lymph nodes:   Cervical, supraclavicular, and axillary nodes normal  Neurologic:   CNII-XII intact, normal strength, sensation and reflexes    throughout  .    Assessment:    Healthy female exam.   Right pelvic pain and her worries about gyn cancer. (She is telling me that she wants a hysterectomy. I have told her that a hysterectomy is not done for preventative purposes).   Plan:     Breast self exam technique reviewed and patient encouraged to perform self-exam monthly. Thin prep Pap smear.  Gyn u/s She declines fasting labs. Says she knows her cholesterol is high, declines referral.

## 2014-10-22 LAB — CYTOLOGY - PAP

## 2014-10-26 ENCOUNTER — Ambulatory Visit (INDEPENDENT_AMBULATORY_CARE_PROVIDER_SITE_OTHER): Payer: BLUE CROSS/BLUE SHIELD

## 2014-10-26 DIAGNOSIS — N958 Other specified menopausal and perimenopausal disorders: Secondary | ICD-10-CM | POA: Diagnosis not present

## 2014-10-26 DIAGNOSIS — D252 Subserosal leiomyoma of uterus: Secondary | ICD-10-CM

## 2014-10-26 DIAGNOSIS — Z0000 Encounter for general adult medical examination without abnormal findings: Secondary | ICD-10-CM

## 2014-10-27 ENCOUNTER — Telehealth: Payer: Self-pay | Admitting: *Deleted

## 2014-10-27 DIAGNOSIS — Z01812 Encounter for preprocedural laboratory examination: Secondary | ICD-10-CM

## 2014-10-27 MED ORDER — MISOPROSTOL 200 MCG PO TABS: ORAL_TABLET | ORAL | Status: DC

## 2014-10-27 NOTE — Telephone Encounter (Signed)
-----   Message from Emily Filbert, MD sent at 10/27/2014  3:55 PM EDT ----- She will need a EMBX. And can you please call in cytotec 600 mcg by mouth the night prior to the biopsy. Thanks

## 2014-10-29 ENCOUNTER — Ambulatory Visit (INDEPENDENT_AMBULATORY_CARE_PROVIDER_SITE_OTHER): Payer: BLUE CROSS/BLUE SHIELD | Admitting: Obstetrics & Gynecology

## 2014-10-29 ENCOUNTER — Other Ambulatory Visit: Payer: Self-pay | Admitting: Obstetrics & Gynecology

## 2014-10-29 ENCOUNTER — Encounter: Payer: Self-pay | Admitting: Obstetrics & Gynecology

## 2014-10-29 VITALS — BP 179/115 | HR 93 | Resp 16 | Ht 66.0 in | Wt 270.0 lb

## 2014-10-29 DIAGNOSIS — R102 Pelvic and perineal pain: Secondary | ICD-10-CM

## 2014-10-29 DIAGNOSIS — R938 Abnormal findings on diagnostic imaging of other specified body structures: Secondary | ICD-10-CM | POA: Diagnosis not present

## 2014-10-29 DIAGNOSIS — R9389 Abnormal findings on diagnostic imaging of other specified body structures: Secondary | ICD-10-CM

## 2014-10-29 NOTE — Progress Notes (Signed)
   Subjective:    Patient ID: Sandra Hartman, female    DOB: Mar 18, 1961, 54 y.o.   MRN: [id number]  HPI  This pleasant morbidly obese 54 yo WW is here today for a EMBX. She was seen recently for pelvic pain. I ordered an u/s that showed very small ovaries but a 15 mm endometrium. She denies PMB. She took cytotec last night.  Review of Systems     Objective:   Physical Exam   UPT negative, consent signed, time out done Cervix prepped with betadine and grasped with a single tooth tenaculum Uterus sounded to 7 cm Pipelle used for 2 passes with a moderate amount of blood obtained. She tolerated the procedure well.  I am not sure if the cells I got were endocervical or endometrial.       Assessment & Plan:  Thickened post menopausal endometium- await pathol. If only endocervical cells, then she will need a d&c.  Pelvic pain and h/o no colonoscopy- refer to GI for colonoscopy

## 2014-11-02 LAB — PATHOLOGY

## 2014-11-05 ENCOUNTER — Telehealth: Payer: Self-pay | Admitting: *Deleted

## 2014-11-05 DIAGNOSIS — N920 Excessive and frequent menstruation with regular cycle: Secondary | ICD-10-CM

## 2014-11-05 MED ORDER — MEGESTROL ACETATE 40 MG PO TABS: 40.0000 mg | ORAL_TABLET | Freq: Every day | ORAL | Status: DC

## 2014-11-05 NOTE — Telephone Encounter (Signed)
Pt called stating that she is bleeding and has been ,since taking Cytotec prior to her Endometrial biopsy.  She states that she is changing pads every hour with clots.  Spoke with Dr Hulan Fray who prescribed Megace 40 mg daily and appt made to discuss path report next week.

## 2014-11-12 ENCOUNTER — Ambulatory Visit (INDEPENDENT_AMBULATORY_CARE_PROVIDER_SITE_OTHER): Payer: BLUE CROSS/BLUE SHIELD | Admitting: Obstetrics & Gynecology

## 2014-11-12 ENCOUNTER — Encounter: Payer: Self-pay | Admitting: Obstetrics & Gynecology

## 2014-11-12 VITALS — BP 169/101 | HR 100 | Resp 16

## 2014-11-12 DIAGNOSIS — N920 Excessive and frequent menstruation with regular cycle: Secondary | ICD-10-CM

## 2014-11-12 NOTE — Progress Notes (Signed)
   Subjective:    Patient ID: Sandra Hartman, female    DOB: 19-Aug-1960, 54 y.o.   MRN: [id number]  HPI 54 yo lovely lady who is here to discuss her EMBX results. They were normal. She is still wanting a hysterectomy/BSO due to her pelvic pain and fears of gyn cancer in the future and her FH (mother had cancer "everywhere" but may have started in the gyn organs, mGM with ?uterine cancer)   Review of Systems     Objective:   Physical Exam        Assessment & Plan:  Reassurance given. I have explained that my philosophy is only to remove organs when there is a clear medical indication. I have recommeded a referral to another gyn.

## 2014-12-02 ENCOUNTER — Encounter: Payer: Self-pay | Admitting: Physician Assistant

## 2014-12-02 ENCOUNTER — Ambulatory Visit (INDEPENDENT_AMBULATORY_CARE_PROVIDER_SITE_OTHER): Payer: BLUE CROSS/BLUE SHIELD | Admitting: Physician Assistant

## 2014-12-02 VITALS — BP 181/116 | HR 92 | Wt 261.0 lb

## 2014-12-02 DIAGNOSIS — R938 Abnormal findings on diagnostic imaging of other specified body structures: Secondary | ICD-10-CM

## 2014-12-02 DIAGNOSIS — R9389 Abnormal findings on diagnostic imaging of other specified body structures: Secondary | ICD-10-CM

## 2014-12-02 DIAGNOSIS — IMO0001 Reserved for inherently not codable concepts without codable children: Secondary | ICD-10-CM

## 2014-12-02 DIAGNOSIS — R1012 Left upper quadrant pain: Secondary | ICD-10-CM

## 2014-12-02 DIAGNOSIS — R030 Elevated blood-pressure reading, without diagnosis of hypertension: Secondary | ICD-10-CM | POA: Diagnosis not present

## 2014-12-02 DIAGNOSIS — R1013 Epigastric pain: Secondary | ICD-10-CM | POA: Diagnosis not present

## 2014-12-02 DIAGNOSIS — R1031 Right lower quadrant pain: Secondary | ICD-10-CM | POA: Diagnosis not present

## 2014-12-02 DIAGNOSIS — R1032 Left lower quadrant pain: Secondary | ICD-10-CM

## 2014-12-02 DIAGNOSIS — Z8049 Family history of malignant neoplasm of other genital organs: Secondary | ICD-10-CM

## 2014-12-02 MED ORDER — OMEPRAZOLE 40 MG PO CPDR: 40.0000 mg | DELAYED_RELEASE_CAPSULE | Freq: Every day | ORAL | Status: DC

## 2014-12-02 NOTE — Patient Instructions (Signed)
Consider miralax 1 capful at bedtime to help bowel move.  Consider probiotic daily.  Will order CT scan of abdomen and pelvis.  Get another referral to GYN for 2nd opinion.  Start omeprazole 40mg  daily to see if helps with upper abdominal pain.  Stop and NSAIDS

## 2014-12-02 NOTE — Progress Notes (Signed)
   Subjective:    Patient ID: Sandra Hartman, female    DOB: January 01, 1961, 54 y.o.   MRN: [id number]  HPI  Pt presents to the clinic with abdominal pain. She has been blaming on MS since february. She was also having some bilateral lower abdominal pain and bleeding. Endometrium was thickened but per GYN biopsy negative. Family hx of uterine cancer sisters have already had hysterectomy. GYN would not do hysterectomy and would like a 2nd opinion.   She is most concerned today with upper abdominal pain. More painful over epigastric and left upper quadrant. Does she to be some worse after eating but feels it most of the time. No vomiting but does feel nauseated. Not tried anythingto make better. Bowel movement harder most of the time but this is her hx. No blood in stool has colonoscopy on Saturday.   Review of Systems  All other systems reviewed and are negative.      Objective:   Physical Exam  Constitutional: She is oriented to person, place, and time. She appears well-developed and well-nourished.  Obese.   HENT:  Head: Normocephalic and atraumatic.  Cardiovascular: Normal rate, regular rhythm and normal heart sounds.   Pulmonary/Chest: Effort normal and breath sounds normal.  Abdominal: Soft. Bowel sounds are normal.  Diffuse tenderness over entire upper abdomen more over epigastric and LUQ.  No guarding or rebound.   Tenderness to palpation over bilateral lower quadrant.   Neurological: She is alert and oriented to person, place, and time.  Skin: Skin is dry.  Psychiatric: She has a normal mood and affect. Her behavior is normal.          Assessment & Plan:  LUQ pain/upper abdominal pain- will get lipase, cmp. Would like to try priloece to see if any acid reflux as the cause. Not as suspicious for gallbladder etiology but pt has had pain since febuarly and would like to get CT scan.   Bilateral lower abdominal pain/thickened endometrium/family hx of uterine cancer/irregular  bleeding- will get 2nd opinion since pt is concerned. Family hx of uterine cancer and sisters have all had hysterectomy. For lower abdominal pain. U/s showed no cause. Need to consider constipation. Consider miralax and probiotic. Pt has colonoscopy on Monday which should help to clean her out and see if some of her pain is reduced.   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 She declines any medication. i request she fax over a BP log to keep on file. BP is very elevated today by our reading.

## 2014-12-03 LAB — COMPLETE METABOLIC PANEL WITH GFR
ALBUMIN: 4.6 g/dL (ref 3.5–5.2)
ALT: 29 U/L (ref 0–35)
AST: 18 U/L (ref 0–37)
Alkaline Phosphatase: 53 U/L (ref 39–117)
BUN: 8 mg/dL (ref 6–23)
CO2: 18 mEq/L — ABNORMAL LOW (ref 19–32)
Calcium: 9.6 mg/dL (ref 8.4–10.5)
Chloride: 106 mEq/L (ref 96–112)
Creat: 0.76 mg/dL (ref 0.50–1.10)
GFR, Est African American: >89 mL/min
GFR, Est Non African American: 89 mL/min
Glucose, Bld: 84 mg/dL (ref 70–99)
POTASSIUM: 4.1 meq/L (ref 3.5–5.3)
SODIUM: 139 meq/L (ref 135–145)
Total Bilirubin: 0.7 mg/dL (ref 0.2–1.2)
Total Protein: 7.6 g/dL (ref 6.0–8.3)

## 2014-12-03 LAB — CBC WITH DIFFERENTIAL/PLATELET
BASOS ABS: 0 10*3/uL (ref 0.0–0.1)
BASOS PCT: 0 % (ref 0–1)
EOS ABS: 0.2 10*3/uL (ref 0.0–0.7)
EOS PCT: 2 % (ref 0–5)
HCT: 43.3 % (ref 36.0–46.0)
Hemoglobin: 15.3 g/dL — ABNORMAL HIGH (ref 12.0–15.0)
Lymphocytes Relative: 33 % (ref 12–46)
Lymphs Abs: 2.7 10*3/uL (ref 0.7–4.0)
MCH: 32.3 pg (ref 26.0–34.0)
MCHC: 35.3 g/dL (ref 30.0–36.0)
MCV: 91.5 fL (ref 78.0–100.0)
MONOS PCT: 5 % (ref 3–12)
MPV: 11.3 fL (ref 8.6–12.4)
Monocytes Absolute: 0.4 10*3/uL (ref 0.1–1.0)
NEUTROS ABS: 4.9 10*3/uL (ref 1.7–7.7)
Neutrophils Relative %: 60 % (ref 43–77)
PLATELETS: 300 10*3/uL (ref 150–400)
RBC: 4.73 MIL/uL (ref 3.87–5.11)
RDW: 12.8 % (ref 11.5–15.5)
WBC: 8.1 10*3/uL (ref 4.0–10.5)

## 2014-12-03 LAB — LIPASE: Lipase: 15 U/L (ref 0–75)

## 2014-12-05 DIAGNOSIS — Z8049 Family history of malignant neoplasm of other genital organs: Secondary | ICD-10-CM | POA: Insufficient documentation

## 2014-12-05 DIAGNOSIS — R9389 Abnormal findings on diagnostic imaging of other specified body structures: Secondary | ICD-10-CM | POA: Insufficient documentation

## 2014-12-05 DIAGNOSIS — R1013 Epigastric pain: Secondary | ICD-10-CM | POA: Insufficient documentation

## 2014-12-05 DIAGNOSIS — R1012 Left upper quadrant pain: Secondary | ICD-10-CM | POA: Insufficient documentation

## 2014-12-05 DIAGNOSIS — R1031 Right lower quadrant pain: Secondary | ICD-10-CM | POA: Insufficient documentation

## 2014-12-05 DIAGNOSIS — R1032 Left lower quadrant pain: Secondary | ICD-10-CM

## 2014-12-08 ENCOUNTER — Encounter: Payer: Self-pay | Admitting: Physician Assistant

## 2014-12-08 DIAGNOSIS — K635 Polyp of colon: Secondary | ICD-10-CM | POA: Insufficient documentation

## 2014-12-09 ENCOUNTER — Encounter: Payer: Self-pay | Admitting: Physician Assistant

## 2014-12-09 ENCOUNTER — Telehealth: Payer: Self-pay

## 2014-12-09 ENCOUNTER — Other Ambulatory Visit: Payer: Self-pay | Admitting: Physician Assistant

## 2014-12-09 DIAGNOSIS — N920 Excessive and frequent menstruation with regular cycle: Secondary | ICD-10-CM

## 2014-12-09 MED ORDER — MEGESTROL ACETATE 40 MG PO TABS: 40.0000 mg | ORAL_TABLET | Freq: Every day | ORAL | Status: DC

## 2014-12-09 NOTE — Telephone Encounter (Signed)
Please follow up on gyn referral to female for 2nd opinion on hysterectomy and family hx.

## 2014-12-09 NOTE — Telephone Encounter (Signed)
Left message for patient to call office to schedule appt. Received referral form primary care.

## 2014-12-10 ENCOUNTER — Other Ambulatory Visit: Payer: Self-pay | Admitting: Physician Assistant

## 2014-12-10 ENCOUNTER — Ambulatory Visit (INDEPENDENT_AMBULATORY_CARE_PROVIDER_SITE_OTHER): Payer: BLUE CROSS/BLUE SHIELD

## 2014-12-10 DIAGNOSIS — D259 Leiomyoma of uterus, unspecified: Secondary | ICD-10-CM | POA: Diagnosis not present

## 2014-12-10 DIAGNOSIS — R9389 Abnormal findings on diagnostic imaging of other specified body structures: Secondary | ICD-10-CM

## 2014-12-10 DIAGNOSIS — R1031 Right lower quadrant pain: Secondary | ICD-10-CM

## 2014-12-10 DIAGNOSIS — R1012 Left upper quadrant pain: Secondary | ICD-10-CM

## 2014-12-10 DIAGNOSIS — M4807 Spinal stenosis, lumbosacral region: Secondary | ICD-10-CM | POA: Diagnosis not present

## 2014-12-10 DIAGNOSIS — R1032 Left lower quadrant pain: Secondary | ICD-10-CM

## 2014-12-10 DIAGNOSIS — Z8049 Family history of malignant neoplasm of other genital organs: Secondary | ICD-10-CM

## 2014-12-10 DIAGNOSIS — R1013 Epigastric pain: Secondary | ICD-10-CM

## 2014-12-10 MED ORDER — IOHEXOL 300 MG/ML  SOLN
100.0000 mL | Freq: Once | INTRAMUSCULAR | Status: AC | PRN
  Administered 2014-12-10: 100 mL via INTRAVENOUS

## 2014-12-24 ENCOUNTER — Telehealth: Payer: Self-pay | Admitting: *Deleted

## 2014-12-24 DIAGNOSIS — Z8049 Family history of malignant neoplasm of other genital organs: Secondary | ICD-10-CM

## 2014-12-24 DIAGNOSIS — R9389 Abnormal findings on diagnostic imaging of other specified body structures: Secondary | ICD-10-CM

## 2014-12-24 NOTE — Telephone Encounter (Signed)
New OBGYN referral placed for a 2nd opinion.

## 2015-01-03 ENCOUNTER — Other Ambulatory Visit: Payer: Self-pay | Admitting: Physician Assistant

## 2015-04-12 ENCOUNTER — Encounter: Payer: Self-pay | Admitting: Physician Assistant

## 2015-04-12 DIAGNOSIS — N8502 Endometrial intraepithelial neoplasia [EIN]: Secondary | ICD-10-CM | POA: Insufficient documentation

## 2015-04-26 ENCOUNTER — Ambulatory Visit (INDEPENDENT_AMBULATORY_CARE_PROVIDER_SITE_OTHER): Payer: BLUE CROSS/BLUE SHIELD | Admitting: Physician Assistant

## 2015-04-26 ENCOUNTER — Encounter: Payer: Self-pay | Admitting: Physician Assistant

## 2015-04-26 VITALS — BP 202/81 | HR 107 | Ht 66.0 in | Wt 250.0 lb

## 2015-04-26 DIAGNOSIS — G35 Multiple sclerosis: Secondary | ICD-10-CM | POA: Diagnosis not present

## 2015-04-26 DIAGNOSIS — R030 Elevated blood-pressure reading, without diagnosis of hypertension: Secondary | ICD-10-CM

## 2015-04-26 DIAGNOSIS — Z87440 Personal history of urinary (tract) infections: Secondary | ICD-10-CM | POA: Diagnosis not present

## 2015-04-26 DIAGNOSIS — IMO0001 Reserved for inherently not codable concepts without codable children: Secondary | ICD-10-CM

## 2015-04-26 DIAGNOSIS — R319 Hematuria, unspecified: Secondary | ICD-10-CM

## 2015-04-26 DIAGNOSIS — R1012 Left upper quadrant pain: Secondary | ICD-10-CM | POA: Diagnosis not present

## 2015-04-26 DIAGNOSIS — N3001 Acute cystitis with hematuria: Secondary | ICD-10-CM

## 2015-04-26 LAB — POCT URINALYSIS DIPSTICK
Bilirubin, UA: NEGATIVE
Glucose, UA: NEGATIVE
Ketones, UA: NEGATIVE
NITRITE UA: NEGATIVE
PH UA: 6
Protein, UA: NEGATIVE
Spec Grav, UA: 1.01
UROBILINOGEN UA: 0.2

## 2015-04-26 MED ORDER — CIPROFLOXACIN HCL 500 MG PO TABS: 500.0000 mg | ORAL_TABLET | Freq: Two times a day (BID) | ORAL | Status: DC

## 2015-04-26 MED ORDER — PREDNISONE 50 MG PO TABS: ORAL_TABLET | ORAL | Status: DC

## 2015-04-26 NOTE — Patient Instructions (Signed)
Hiatal Hernia A hiatal hernia occurs when part of your stomach slides above the muscle that separates your abdomen from your chest (diaphragm). You can be born with a hiatal hernia (congenital), or it may develop over time. In almost all cases of hiatal hernia, only the top part of the stomach pushes through.  Many people have a hiatal hernia with no symptoms. The larger the hernia, the more likely that you will have symptoms. In some cases, a hiatal hernia allows stomach acid to flow back into the tube that carries food from your mouth to your stomach (esophagus). This may cause heartburn symptoms. Severe heartburn symptoms may mean you have developed a condition called gastroesophageal reflux disease (GERD).  CAUSES  Hiatal hernias are caused by a weakness in the opening (hiatus) where your esophagus passes through your diaphragm to attach to the upper part of your stomach. You may be born with a weakness in your hiatus, or a weakness can develop. RISK FACTORS Older age is a major risk factor for a hiatal hernia. Anything that increases pressure on your diaphragm can also increase your risk of a hiatal hernia. This includes:  Pregnancy.  Excess weight.  Frequent constipation. SIGNS AND SYMPTOMS  People with a hiatal hernia often have no symptoms. If symptoms develop, they are almost always caused by GERD. They may include:  Heartburn.  Belching.  Indigestion.  Trouble swallowing.  Coughing or wheezing.  Sore throat.  Hoarseness.  Chest pain. DIAGNOSIS  A hiatal hernia is sometimes found during an exam for another problem. Your health care provider may suspect a hiatal hernia if you have symptoms of GERD. Tests may be done to diagnose GERD. These may include:  X-rays of your stomach or chest.  An upper gastrointestinal (GI) series. This is an X-ray exam of your GI tract involving the use of a chalky liquid that you swallow. The liquid shows up clearly on the X-ray.  Endoscopy.  This is a procedure to look into your stomach using a thin, flexible tube that has a tiny camera and light on the end of it. TREATMENT  If you have no symptoms, you may not need treatment. If you have symptoms, treatment may include:  Dietary and lifestyle changes to help reduce GERD symptoms.  Medicines. These may include:  Over-the-counter antacids.  Medicines that make your stomach empty more quickly.  Medicines that block the production of stomach acid (H2 blockers).  Stronger medicines to reduce stomach acid (proton pump inhibitors).  You may need surgery to repair the hernia if other treatments are not helping. HOME CARE INSTRUCTIONS   Take all medicines as directed by your health care provider.  Quit smoking, if you smoke.  Try to achieve and maintain a healthy body weight.  Eat frequent small meals instead of three large meals a day. This keeps your stomach from getting too full.  Eat slowly.  Do not lie down right after eating.  Do noteat 1-2 hours before bed.   Do not drink beverages with caffeine. These include cola, coffee, cocoa, and tea.  Do not drink alcohol.  Avoid foods that can make symptoms of GERD worse. These may include:  Fatty foods.  Citrus fruits.  Other foods and drinks that contain acid.  Avoid putting pressure on your belly. Anything that puts pressure on your belly increases the amount of acid that may be pushed up into your esophagus.   Avoid bending over, especially after eating.  Raise the head of your bed   by putting blocks under the legs. This keeps your head and esophagus higher than your stomach.  Do not wear tight clothing around your chest or stomach.  Try not to strain when having a bowel movement, when urinating, or when lifting heavy objects. SEEK MEDICAL CARE IF:  Your symptoms are not controlled with medicines or lifestyle changes.  You are having trouble swallowing.  You have coughing or wheezing that will not  go away. SEEK IMMEDIATE MEDICAL CARE IF:  Your pain is getting worse.  Your pain spreads to your arms, neck, jaw, teeth, or back.  You have shortness of breath.  You sweat for no reason.  You feel sick to your stomach (nauseous) or vomit.  You vomit blood.  You have bright red blood in your stools.  You have black, tarry stools.  Document Released: 10/07/2003 Document Revised: 12/01/2013 Document Reviewed: 07/04/2013 [employer] Patient Information 2015 [employer]. This information is not intended to replace advice given to you by your health care provider. Make sure you discuss any questions you have with your health care provider.  

## 2015-04-26 NOTE — Progress Notes (Signed)
   Subjective:    Patient ID: Sandra Hartman, female    DOB: 1961/02/12, 54 y.o.   MRN: [id number]  HPI  Patient is a 54 year old female she presents to the clinic to follow-up on previous UTI after hysterectomy. She did have a complete hysterectomy on 03/30/2015. She explains she is not having the dysuria but she does "" it feels weird down there".  she denies any fever, chills, vaginal discharge. She finished Macrobid last Monday.   She also continues to have some left upper quadrant pain. Should we addressed this earlier this year and has not resolved. She never started omeprazole until 2 weeks ago. She does not feel like the omeprazole is helping. She has a lot of sensation of pressure. She feels like her food is getting caught in her epigastric area. She denies any nausea or vomiting. She denies any coughing. She does have bad hiccups. There is no radiation of pain into back.   Blood pressure certainly elevated today. She discussed she has been checking at home and been under 140/90. She does have whitecoat hypertension. She has been diagnosed with MS. Since his surgery she has had a lot of leg weakness and pain. She requests a burst of prednisone to help get her back on her feet.      Review of Systems  All other systems reviewed and are negative.      Objective:   Physical Exam  Constitutional: She is oriented to person, place, and time. She appears well-developed and well-nourished.  Obesity.   HENT:  Head: Normocephalic and atraumatic.  Cardiovascular: Normal rate, regular rhythm and normal heart sounds.   Pulmonary/Chest: Effort normal and breath sounds normal.  Abdominal: Soft. Bowel sounds are normal. She exhibits no distension.  Epigastric and LUQ tenderness to palpation. No guarding or rebound. No masses or hernia palpated.   Neurological: She is alert and oriented to person, place, and time.  Skin: Skin is dry.  Psychiatric: She has a normal mood and affect. Her behavior  is normal.          Assessment & Plan:  Hx of UTI/acute cysitis- .. Results for orders placed or performed in visit on 04/26/15  POCT urinalysis dipstick  Result Value Ref Range   Color, UA yellow    Clarity, UA clear    Glucose, UA neg    Bilirubin, UA neg    Ketones, UA neg    Spec Grav, UA 1.010    Blood, UA moderate    pH, UA 6.0    Protein, UA neg    Urobilinogen, UA 0.2    Nitrite, UA neg    Leukocytes, UA moderate (2+) (A) Negative   Went ahead and treated with cipro due to moderate WBC. Will culture.   LUQ pain/food get stuck- will send to GI for evaluation and EGD. Continue on omeprazole can increase to bid. Symptoms concerning for hiatial hernia. Reviewed CT with patient that was done of abdomen and showed wear ventral hernia was lower in abdomen and should not be contributing to pain; however, encouraged GI doctor to also view.   MS- treated with prednisone burst for 5 days.   Elevated BP/White coat hypertension- dicussed with patient to keep weekly checked and under 140/90.

## 2015-04-28 LAB — URINE CULTURE
COLONY COUNT: NO GROWTH
Organism ID, Bacteria: NO GROWTH

## 2015-04-30 ENCOUNTER — Encounter: Payer: Self-pay | Admitting: Physician Assistant

## 2015-05-01 ENCOUNTER — Other Ambulatory Visit: Payer: Self-pay | Admitting: Physician Assistant

## 2015-05-01 DIAGNOSIS — R1310 Dysphagia, unspecified: Secondary | ICD-10-CM

## 2015-05-01 DIAGNOSIS — R1012 Left upper quadrant pain: Secondary | ICD-10-CM

## 2015-05-27 ENCOUNTER — Encounter: Payer: Self-pay | Admitting: Physician Assistant

## 2015-05-31 ENCOUNTER — Ambulatory Visit (INDEPENDENT_AMBULATORY_CARE_PROVIDER_SITE_OTHER): Payer: BLUE CROSS/BLUE SHIELD | Admitting: Physician Assistant

## 2015-05-31 ENCOUNTER — Encounter: Payer: Self-pay | Admitting: Physician Assistant

## 2015-05-31 VITALS — BP 182/130 | HR 116 | Ht 66.0 in | Wt 256.0 lb

## 2015-05-31 DIAGNOSIS — IMO0001 Reserved for inherently not codable concepts without codable children: Secondary | ICD-10-CM

## 2015-05-31 DIAGNOSIS — R1031 Right lower quadrant pain: Secondary | ICD-10-CM | POA: Diagnosis not present

## 2015-05-31 DIAGNOSIS — R030 Elevated blood-pressure reading, without diagnosis of hypertension: Secondary | ICD-10-CM

## 2015-05-31 MED ORDER — GABAPENTIN 100 MG PO CAPS: 100.0000 mg | ORAL_CAPSULE | Freq: Three times a day (TID) | ORAL | Status: DC

## 2015-05-31 MED ORDER — PREDNISONE 50 MG PO TABS: ORAL_TABLET | ORAL | Status: DC

## 2015-05-31 NOTE — Progress Notes (Signed)
   Subjective:    Patient ID: Sandra Hartman, female    DOB: 10/11/1960, 54 y.o.   MRN: [id number]  HPI  Pt is a 54 yo female who presents to the clinic with RLQ intermittent pain. She had a hysterectomy in august and turned out to be a bigger surgery than expected. They actually had to open her up from navel down to suprapubic bone. She started feeling these pains during her 6 weeks recovery. Now for 2 more months has had RLQ pain.  Describes as sharp pains with a generalized soreness that is "nagging". Denies any fever, chills, dysuria, bowel changes, melena, diarhea.    Review of Systems  All other systems reviewed and are negative.      Objective:   Physical Exam  Constitutional: She appears well-developed and well-nourished.  Obese.   HENT:  Head: Normocephalic and atraumatic.  Cardiovascular: Regular rhythm and normal heart sounds.   Pulmonary/Chest: Effort normal and breath sounds normal. She has no wheezes.  Abdominal: Soft. Bowel sounds are normal. She exhibits no mass. There is no rebound and no guarding.  Mild tenderness to palpation in RLQ.   Psychiatric: She has a normal mood and affect. Her behavior is normal.          Assessment & Plan:  RLQ- unclear etiology. I feel like pain could be some scar tissue/adhesions. Discussed light abdomen massage daily. If symptoms persist or worsen could consider CT scan. No red flag symptoms. Discussed gabapentin if there is some neuropathy cause by surgery. Sent to pharmacy to try. Discussed side effects. Follow up as needed.   White Coat HTN- continues to check BP and always under 140/90 at home and work.

## 2015-06-01 LAB — CBC WITH DIFFERENTIAL/PLATELET
BASOS PCT: 1 % (ref 0–1)
Basophils Absolute: 0.1 10*3/uL (ref 0.0–0.1)
Eosinophils Absolute: 0.3 10*3/uL (ref 0.0–0.7)
Eosinophils Relative: 4 % (ref 0–5)
HEMATOCRIT: 36.6 % (ref 36.0–46.0)
Hemoglobin: 12.2 g/dL (ref 12.0–15.0)
Lymphocytes Relative: 27 % (ref 12–46)
Lymphs Abs: 2.2 10*3/uL (ref 0.7–4.0)
MCH: 28.8 pg (ref 26.0–34.0)
MCHC: 33.3 g/dL (ref 30.0–36.0)
MCV: 86.3 fL (ref 78.0–100.0)
MONOS PCT: 6 % (ref 3–12)
MPV: 10.3 fL (ref 8.6–12.4)
Monocytes Absolute: 0.5 10*3/uL (ref 0.1–1.0)
NEUTROS ABS: 5.1 10*3/uL (ref 1.7–7.7)
Neutrophils Relative %: 62 % (ref 43–77)
Platelets: 411 10*3/uL — ABNORMAL HIGH (ref 150–400)
RBC: 4.24 MIL/uL (ref 3.87–5.11)
RDW: 14.4 % (ref 11.5–15.5)
WBC: 8.2 10*3/uL (ref 4.0–10.5)

## 2015-06-02 DIAGNOSIS — R1031 Right lower quadrant pain: Secondary | ICD-10-CM | POA: Insufficient documentation

## 2015-07-21 ENCOUNTER — Ambulatory Visit (INDEPENDENT_AMBULATORY_CARE_PROVIDER_SITE_OTHER): Payer: BLUE CROSS/BLUE SHIELD | Admitting: Physician Assistant

## 2015-07-21 ENCOUNTER — Encounter: Payer: Self-pay | Admitting: Physician Assistant

## 2015-07-21 VITALS — BP 179/90 | HR 100 | Ht 66.0 in | Wt 257.0 lb

## 2015-07-21 DIAGNOSIS — R030 Elevated blood-pressure reading, without diagnosis of hypertension: Secondary | ICD-10-CM

## 2015-07-21 DIAGNOSIS — M533 Sacrococcygeal disorders, not elsewhere classified: Secondary | ICD-10-CM | POA: Diagnosis not present

## 2015-07-21 DIAGNOSIS — IMO0001 Reserved for inherently not codable concepts without codable children: Secondary | ICD-10-CM

## 2015-07-21 MED ORDER — KETOROLAC TROMETHAMINE 60 MG/2ML IM SOLN
60.0000 mg | Freq: Once | INTRAMUSCULAR | Status: AC
  Administered 2015-07-21: 60 mg via INTRAMUSCULAR

## 2015-07-21 MED ORDER — CYCLOBENZAPRINE HCL 10 MG PO TABS: 10.0000 mg | ORAL_TABLET | Freq: Three times a day (TID) | ORAL | Status: DC | PRN

## 2015-07-21 MED ORDER — MELOXICAM 15 MG PO TABS: 15.0000 mg | ORAL_TABLET | Freq: Every day | ORAL | Status: DC

## 2015-07-21 MED ORDER — HYDROCODONE-ACETAMINOPHEN 5-325 MG PO TABS: 1.0000 | ORAL_TABLET | Freq: Three times a day (TID) | ORAL | Status: DC | PRN

## 2015-07-21 NOTE — Progress Notes (Signed)
   Subjective:    Patient ID: Sandra Hartman, female    DOB: 11/27/1960, 54 y.o.   MRN: [id number]  HPI  Pt presents to the clinic with right sided low back pain. She has a hx of this in the past. Usually we get better on its on with some ibuprofen and warm compresses. For the last few days she has noticed pain and wanted to have some done since christmas is coming up and her insurance is running out and she has met deductible. Pain is located low back right side and radiates into right buttocks. Worse with laying flat in bed and getting in car and sitting. Best usually when standing. No known injury. She did just have total hysterectomy surgery about 3 months ago. Taking some ibuprofen but has not done anything else. Denies any bowel or bladder dysfunction. No saddle numbness.     Review of Systems  All other systems reviewed and are negative.      Objective:   Physical Exam  Constitutional: She is oriented to person, place, and time. She appears well-developed and well-nourished.  Obese.   Cardiovascular: Normal rate, regular rhythm and normal heart sounds.   Musculoskeletal:  No tenderness to palpation over lumbar spine to palpation.  Good ROM at waist.  More discomfort with forward flexion.  Pain over right SI joint to palpation.  Tight lumbar paraspinous muscles more right than left.  Bilateral lower extremity 5/5 strength.  Negative straight leg test, bilateral.    Neurological: She is alert and oriented to person, place, and time. She has normal reflexes. Coordination normal.  Psychiatric: She has a normal mood and affect. Her behavior is normal.          Assessment & Plan:  Right sided SI joint dysfunction- Toradol '60mg'$  IM given today. mobic started daily as needed. No hx of PUD. She will take with meals. Flexeril as needed. Sedation warning given. Will mostly use at bedtime. Home exercises given. Discussed formal PT. Her insurance will be switching at beginning of year.  Will consider then. She would like to be aggressive with therapy. She just completed oral steroids for another condition. Will hold on this today. Will send to Dr. Georgina Snell for potential SI joint injection. Small quanity of norco given for acute pain, per pt tramadol not working.    White coat hypertension- per pt readings continue to be normal when she checks away from office.

## 2015-07-21 NOTE — Patient Instructions (Signed)
Friday at 8:30 to see Dr. Georgina Snell.

## 2015-07-23 ENCOUNTER — Encounter: Payer: Self-pay | Admitting: Family Medicine

## 2015-07-23 ENCOUNTER — Ambulatory Visit (INDEPENDENT_AMBULATORY_CARE_PROVIDER_SITE_OTHER): Payer: BLUE CROSS/BLUE SHIELD | Admitting: Family Medicine

## 2015-07-23 VITALS — BP 173/83 | HR 93 | Wt 258.0 lb

## 2015-07-23 DIAGNOSIS — G35 Multiple sclerosis: Secondary | ICD-10-CM | POA: Diagnosis not present

## 2015-07-23 DIAGNOSIS — M533 Sacrococcygeal disorders, not elsewhere classified: Secondary | ICD-10-CM

## 2015-07-23 NOTE — Patient Instructions (Signed)
Thank you for coming in today. Call or go to the ER if you develop a large red swollen joint with extreme pain or oozing puss.  Come back or go to the emergency room if you notice new weakness new numbness problems walking or bowel or bladder problems. Do the exercises Jade discussed.  Return in 2-4 weeks.

## 2015-07-23 NOTE — Progress Notes (Signed)
   Subjective:    I'm seeing this patient as a consultation for:  Sandra Planas PA-C  CC: Back pain  HPI: She notes a one-month history of bilateral right worse than left low back pain. Pain is worse with activity and better with rest. No new radiating pain weakness numbness fevers or chills. She's tried some home exercises and some over-the-counter medicines which did not help much. No fevers chills nausea vomiting or diarrhea. She's done well with SI injections in the past. No bowel bladder dysfunction.  Past medical history, Surgical history, Family history not pertinant except as noted below, Social history, Allergies, and medications have been entered into the medical record, reviewed, and no changes needed.   Review of Systems: No headache, visual changes, nausea, vomiting, diarrhea, constipation, dizziness, abdominal pain, skin rash, fevers, chills, night sweats, weight loss, swollen lymph nodes, body aches, joint swelling, muscle aches, chest pain, shortness of breath, mood changes, visual or auditory hallucinations.   Objective:    Filed Vitals:   07/23/15 0835  BP: 173/83  Pulse: 93   General: Well Developed, well nourished, and in no acute distress.  Neuro/Psych: Alert and oriented x3, extra-ocular muscles intact, able to move all 4 extremities, sensation grossly intact. Skin: Warm and dry, no rashes noted.  Respiratory: Not using accessory muscles, speaking in full sentences, trachea midline.  Cardiovascular: Pulses palpable, no extremity edema. Abdomen: Does not appear distended. MSK: Back: Nontender to midline. Tender palpation right SI joint. Low back range of motion is normal and intact. Negative straight leg raise test Corky Sox test and crossover to pretzel stretch test. Lurched any sensation strength and reflexes are equal normal throughout. Normal gait.  Procedure: Real-time Ultrasound Guided Injection of SI joint  Device: GE Logiq E  Images permanently stored and  available for review in the ultrasound unit. Verbal informed consent obtained. Discussed risks and benefits of procedure. Warned about infection bleeding damage to structures skin hypopigmentation and fat atrophy among others. Patient expresses understanding and agreement Time-out conducted.  Noted no overlying erythema, induration, or other signs of local infection.  Skin prepped in a sterile fashion.  Local anesthesia: Topical Ethyl chloride.  With sterile technique and under real time ultrasound guidance: 40 mg of Kenalog and 4 mL of Marcaine injected easily.  Completed without difficulty  Pain immediately resolved suggesting accurate placement of the medication.  Advised to call if fevers/chills, erythema, induration, drainage, or persistent bleeding.  Images permanently stored and available for review in the ultrasound unit.  Impression: Technically successful ultrasound guided injection.    No results found for this or any previous visit (from the past 24 hour(s)). No results found.  Impression and Recommendations:   This case required medical decision making of moderate complexity.

## 2015-07-23 NOTE — Assessment & Plan Note (Signed)
Patient has several different causes for pain likely. She certainly has spinal stenosis and DDD of her lumbar back based on CT scan in May 2016. She likely has some myofascial disruption as well. Plan for injection today for by home exercises. If not better encourage physical therapy. Recheck in a few weeks.

## 2015-08-28 ENCOUNTER — Other Ambulatory Visit: Payer: Self-pay | Admitting: Physician Assistant

## 2015-09-06 IMAGING — CT CT ABD-PELV W/ CM
2 of 5 series · 13 of 32 positions shown, 18 images · IV contrast (omnipaque)
Comparison: Pelvic ultrasound October 26, 2014

CLINICAL DATA: Left upper and right lower quadrant abdominal pain
for 2 months

EXAM:
CT ABDOMEN AND PELVIS WITH CONTRAST
TECHNIQUE: Multidetector CT imaging of the abdomen and pelvis was performed
using the standard protocol following bolus administration of
intravenous contrast. Oral contrast was also administered.
CONTRAST:  100mL OMNIPAQUE IOHEXOL 300 MG/ML  SOLN

[Series 2: abd/pelvis with · axial · 0.88mm/px · z∈[-462,-126]mm · 5 of 101 slices shown, 10 images]
[im 17/101  soft-tissue]
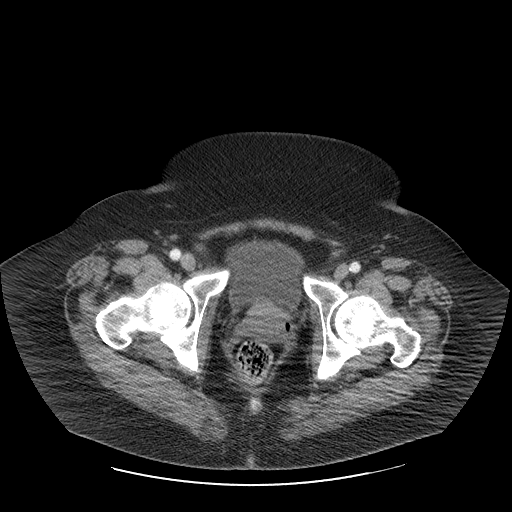
[im 17/101  bone]
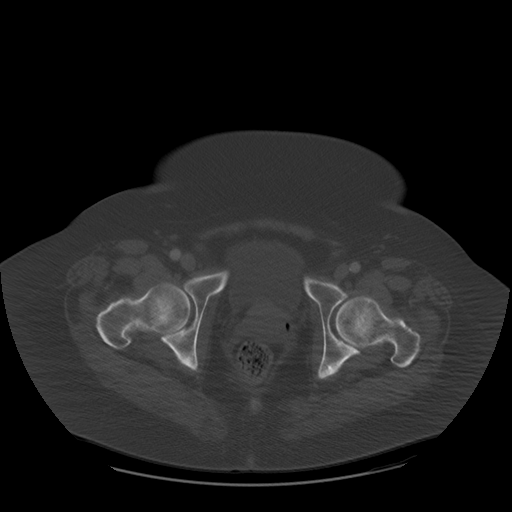
[im 34/101  soft-tissue]
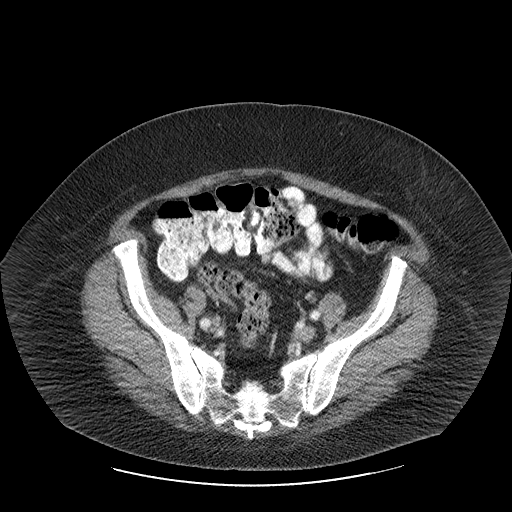
[im 34/101  lung]
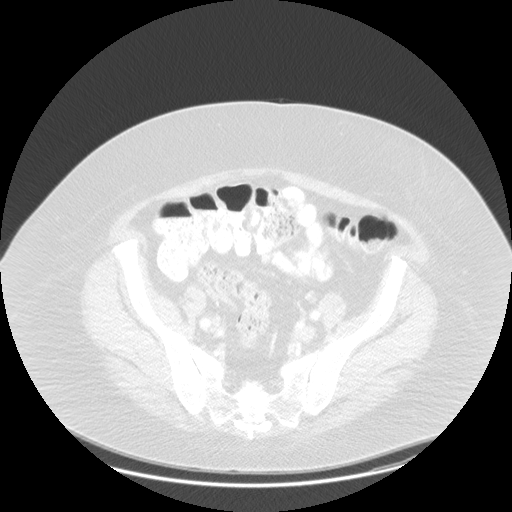
[im 51/101  soft-tissue]
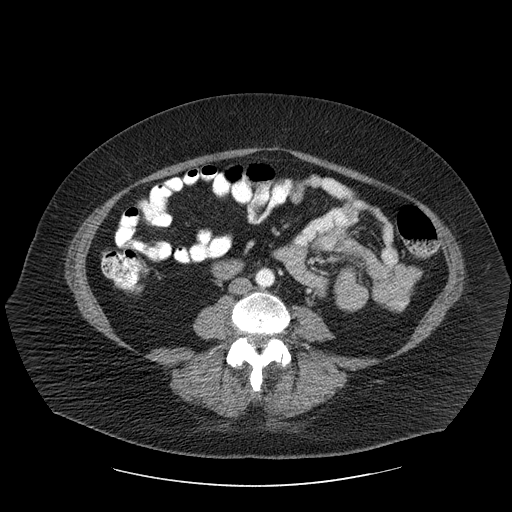
[im 51/101  lung]
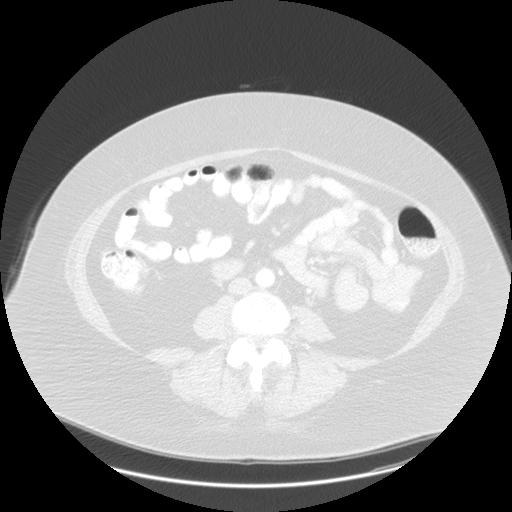
[im 67/101  soft-tissue]
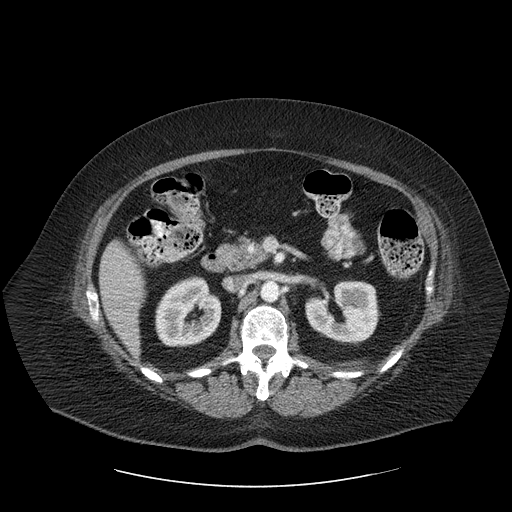
[im 67/101  lung]
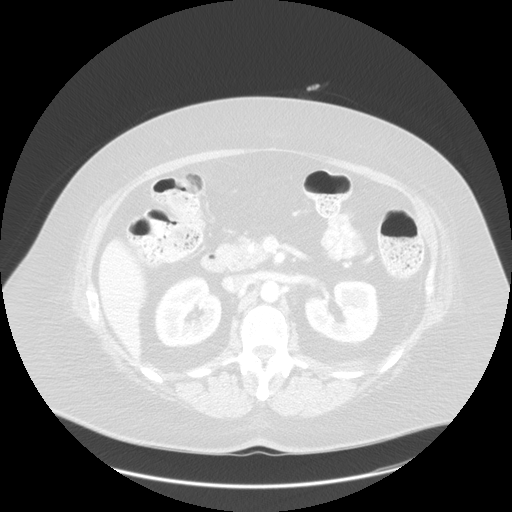
[im 84/101  soft-tissue]
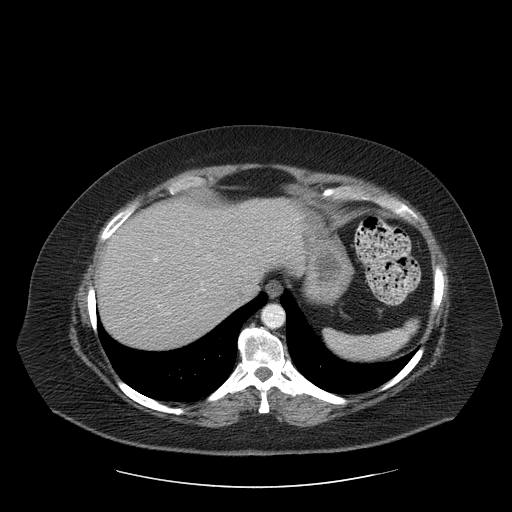
[im 84/101  lung]
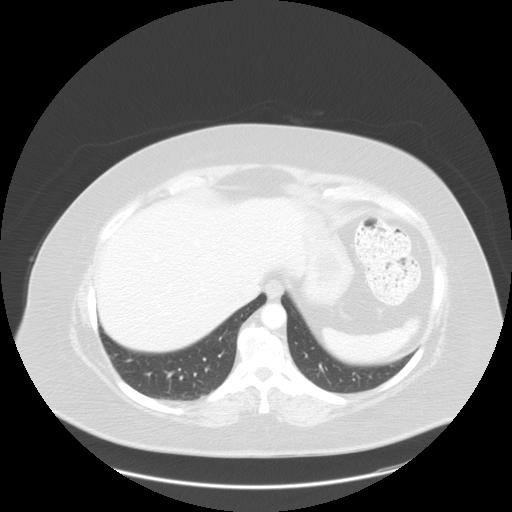

[Series 400: sagittal · sagittal · 1.00mm/px · 8 of 177 slices shown]
[im 17/177  soft-tissue]
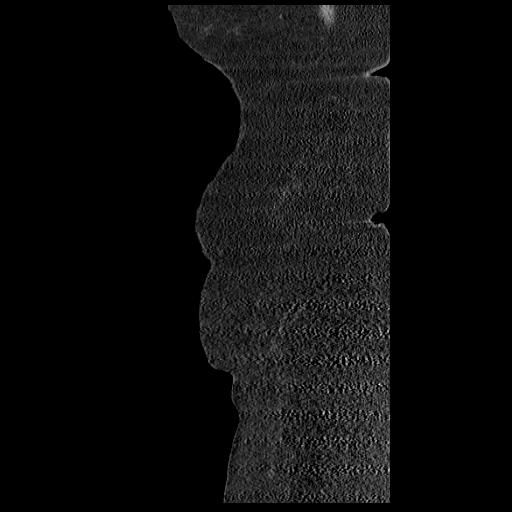
[im 33/177  soft-tissue]
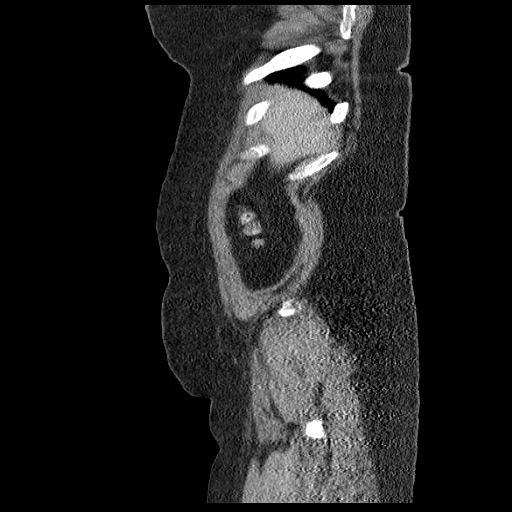
[im 65/177  soft-tissue]
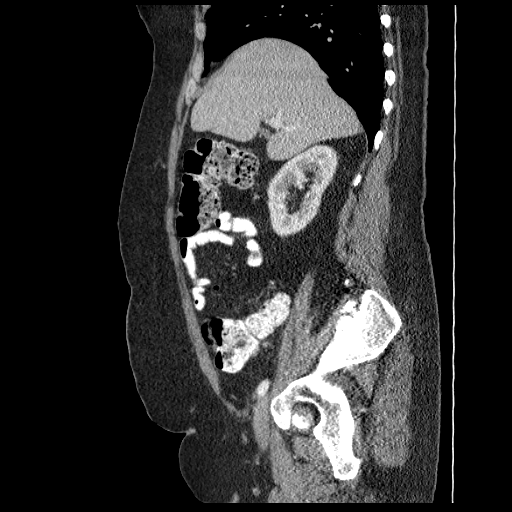
[im 81/177  soft-tissue]
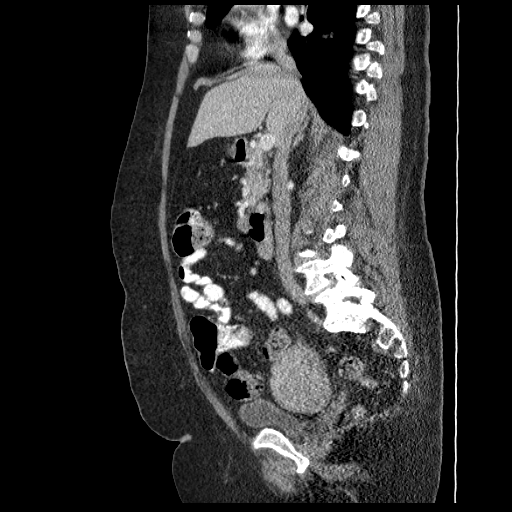
[im 97/177  soft-tissue]
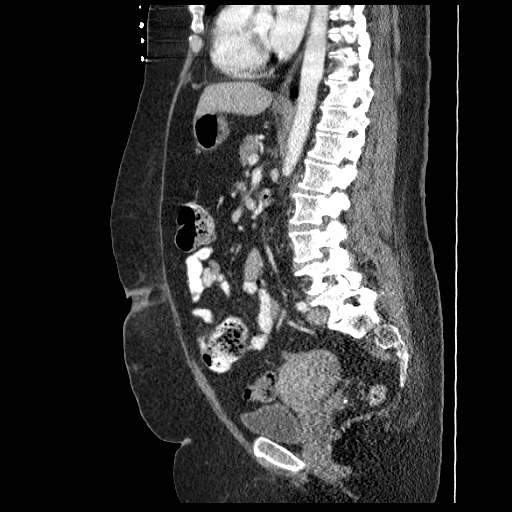
[im 113/177  soft-tissue]
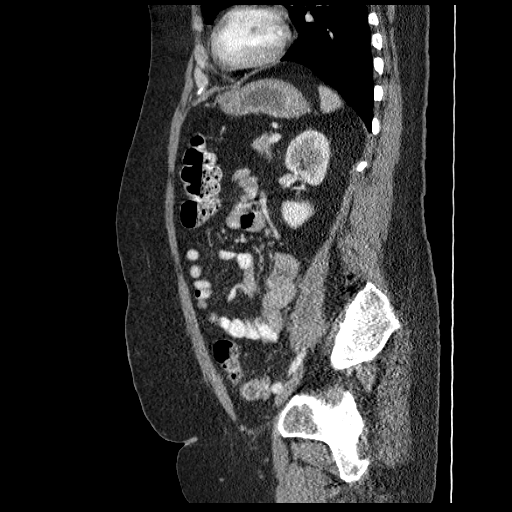
[im 145/177  soft-tissue]
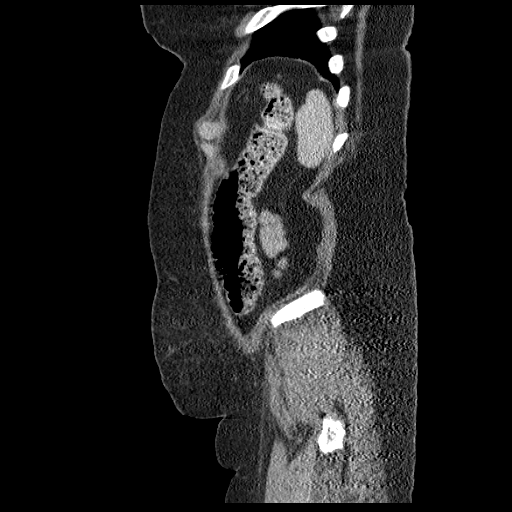
[im 161/177  soft-tissue]
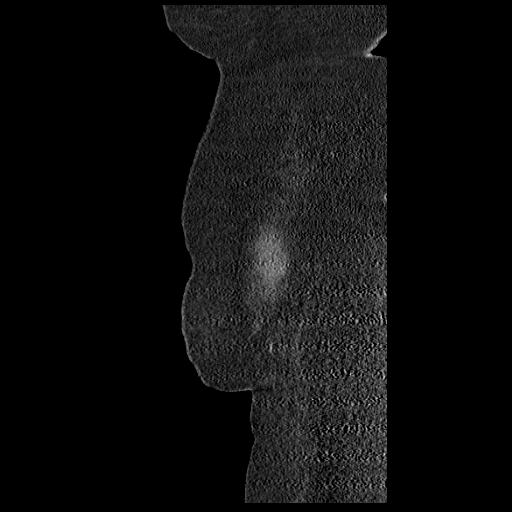

[13 of 32 positions shown; findings below may reference images not displayed]

FINDINGS: Lung bases are clear.

No focal liver lesions are identified. Gallbladder wall is not
thickened. There is no biliary duct dilatation.

Spleen, pancreas, and adrenals appear normal. Kidneys bilaterally
show no mass or hydronephrosis on either side. There is no renal or
ureteral calculus on either side.

In the pelvis, the urinary bladder is midline with normal wall
thickness. The uterus is irregular in contour consistent with
leiomyomatous change. Calcification in the lower uterus is
consistent with leiomyomatous calcification. Outside of the uterus,
there is no pelvic mass. There is no pelvic fluid. There are
scattered sigmoid diverticula without diverticulitis. Appendix
appears normal.

There is a minimal ventral hernia containing only fat.

There is no bowel obstruction. No free air or portal venous air.
There is no ascites, adenopathy, or abscess in the abdomen or
pelvis. There is no abdominal aortic aneurysm. There is degenerative
change in the lumbar spine. There are no blastic or lytic bone
lesions. There is spinal stenosis at L4-5 and L5-S1 due to disc
protrusion and bony hypertrophy.
IMPRESSION: Leiomyomatous uterus.

Spinal stenosis at L4-5 and L5-S1 due to diffuse disc protrusion and
bony hypertrophy.

No bowel obstruction. No abscess. Appendix appears normal. No renal
or ureteral calculi. No hydronephrosis.

Minimal ventral hernia containing only fat.

## 2015-09-14 ENCOUNTER — Other Ambulatory Visit: Payer: Self-pay | Admitting: Physician Assistant

## 2015-09-14 DIAGNOSIS — Z1231 Encounter for screening mammogram for malignant neoplasm of breast: Secondary | ICD-10-CM

## 2015-11-10 ENCOUNTER — Ambulatory Visit (INDEPENDENT_AMBULATORY_CARE_PROVIDER_SITE_OTHER): Payer: BLUE CROSS/BLUE SHIELD

## 2015-11-10 DIAGNOSIS — Z1231 Encounter for screening mammogram for malignant neoplasm of breast: Secondary | ICD-10-CM

## 2016-02-21 ENCOUNTER — Other Ambulatory Visit: Payer: Self-pay | Admitting: Neurology

## 2016-02-21 DIAGNOSIS — G35 Multiple sclerosis: Secondary | ICD-10-CM

## 2016-02-28 ENCOUNTER — Ambulatory Visit (INDEPENDENT_AMBULATORY_CARE_PROVIDER_SITE_OTHER): Payer: BLUE CROSS/BLUE SHIELD

## 2016-02-28 ENCOUNTER — Other Ambulatory Visit: Payer: Self-pay | Admitting: Neurology

## 2016-02-28 DIAGNOSIS — G35 Multiple sclerosis: Secondary | ICD-10-CM | POA: Diagnosis not present

## 2016-02-28 MED ORDER — GADOBENATE DIMEGLUMINE 529 MG/ML IV SOLN
20.0000 mL | Freq: Once | INTRAVENOUS | Status: AC | PRN
  Administered 2016-02-28: 20 mL via INTRAVENOUS

## 2016-04-04 ENCOUNTER — Encounter: Payer: Self-pay | Admitting: Physician Assistant

## 2016-04-04 ENCOUNTER — Ambulatory Visit (INDEPENDENT_AMBULATORY_CARE_PROVIDER_SITE_OTHER): Payer: BLUE CROSS/BLUE SHIELD | Admitting: Physician Assistant

## 2016-04-04 VITALS — BP 160/98 | HR 99 | Ht 66.0 in | Wt 271.0 lb

## 2016-04-04 DIAGNOSIS — Z13220 Encounter for screening for lipoid disorders: Secondary | ICD-10-CM

## 2016-04-04 DIAGNOSIS — IMO0001 Reserved for inherently not codable concepts without codable children: Secondary | ICD-10-CM

## 2016-04-04 DIAGNOSIS — Z131 Encounter for screening for diabetes mellitus: Secondary | ICD-10-CM

## 2016-04-04 DIAGNOSIS — Z0000 Encounter for general adult medical examination without abnormal findings: Secondary | ICD-10-CM

## 2016-04-04 DIAGNOSIS — R030 Elevated blood-pressure reading, without diagnosis of hypertension: Secondary | ICD-10-CM

## 2016-04-04 DIAGNOSIS — Z1159 Encounter for screening for other viral diseases: Secondary | ICD-10-CM | POA: Diagnosis not present

## 2016-04-04 NOTE — Patient Instructions (Signed)

## 2016-04-04 NOTE — Progress Notes (Signed)
Subjective:     Sandra Hartman is a 55 y.o. female and is here for a comprehensive physical exam. The patient reports no problems.  Social History   Social History  . Marital status: Married    Spouse name: N/A  . Number of children: N/A  . Years of education: N/A   Occupational History  . Not on file.   Social History Main Topics  . Smoking status: Never Smoker  . Smokeless tobacco: Never Used  . Alcohol use No  . Drug use: No  . Sexual activity: Not Currently   Other Topics Concern  . Not on file   Social History Narrative  . No narrative on file   Health Maintenance  Topic Date Due  . Hepatitis C Screening  1961-06-29  . INFLUENZA VACCINE  04/04/2017 (Originally 02/29/2016)  . HIV Screening  04/04/2017 (Originally 01/09/1975)  . TETANUS/TDAP  04/04/2026 (Originally 01/09/1979)  . PAP SMEAR  10/20/2017  . MAMMOGRAM  11/09/2017  . COLONOSCOPY  12/07/2024    The following portions of the patient's history were reviewed and updated as appropriate: allergies, current medications, past family history, past medical history, past social history, past surgical history and problem list.  Review of Systems pt has ongoing MS symptoms. she is seeing neurologist and starting a medication in near future.    Objective:    BP (!) 160/98   Pulse 99   Ht 5\' 6"  (1.676 m)   Wt 271 lb (122.9 kg)   LMP 09/28/2012 (Approximate)   BMI 43.74 kg/m  General appearance: alert, cooperative and appears stated age Head: Normocephalic, without obvious abnormality, atraumatic Eyes: conjunctivae/corneas clear. PERRL, EOM's intact. Fundi benign. Ears: normal TM's and external ear canals both ears Nose: Nares normal. Septum midline. Mucosa normal. No drainage or sinus tenderness. Throat: lips, mucosa, and tongue normal; teeth and gums normal Neck: no adenopathy, no carotid bruit, no JVD, supple, symmetrical, trachea midline and thyroid not enlarged, symmetric, no tenderness/mass/nodules Back:  symmetric, no curvature. ROM normal. No CVA tenderness. Lungs: clear to auscultation bilaterally Heart: regular rate and rhythm, S1, S2 normal, no murmur, click, rub or gallop Abdomen: soft, non-tender; bowel sounds normal; no masses,  no organomegaly Extremities: extremities normal, atraumatic, no cyanosis or edema Pulses: 2+ and symmetric Skin: Skin color, texture, turgor normal. No rashes or lesions Lymph nodes: Cervical, supraclavicular, and axillary nodes normal. Neurologic: Alert and oriented X 3, normal strength and tone. Normal symmetric reflexes. Normal coordination and gait    Assessment:    Healthy female exam.      Plan:  CPE- pt declined flu shot and tdap due to her fear of interference with MS.  Hep C screening ordered.  Declined HIV.  Lipid and cmp ordered.  Mammogram done 10/2015.  Discussed exercise daily at least 150 minutes a week. Pt on vitamin d and calcium.   White coat HTN- went down by end of visit. Would like for patient to keep log and return to assure me that BP's are good when at home.    See After Visit Summary for Counseling Recommendations

## 2016-04-05 LAB — HEPATITIS C ANTIBODY: HCV AB: NEGATIVE

## 2016-04-05 LAB — BASIC METABOLIC PANEL WITH GFR
BUN: 14 mg/dL (ref 7–25)
CHLORIDE: 102 mmol/L (ref 98–110)
CO2: 20 mmol/L (ref 20–31)
Calcium: 9.5 mg/dL (ref 8.6–10.4)
Creat: 0.72 mg/dL (ref 0.50–1.05)
Glucose, Bld: 81 mg/dL (ref 65–99)
POTASSIUM: 4.3 mmol/L (ref 3.5–5.3)
SODIUM: 141 mmol/L (ref 135–146)

## 2016-04-05 LAB — LIPID PANEL
Cholesterol: 245 mg/dL — ABNORMAL HIGH (ref 125–200)
HDL: 54 mg/dL (ref >=46)
LDL CALC: 162 mg/dL — AB (ref <130)
TRIGLYCERIDES: 146 mg/dL (ref <150)
Total CHOL/HDL Ratio: 4.5 Ratio (ref <=5.0)
VLDL: 29 mg/dL (ref <30)

## 2016-06-06 ENCOUNTER — Encounter: Payer: Self-pay | Admitting: Family Medicine

## 2016-06-19 ENCOUNTER — Encounter: Payer: Self-pay | Admitting: Family Medicine

## 2016-06-19 ENCOUNTER — Ambulatory Visit (INDEPENDENT_AMBULATORY_CARE_PROVIDER_SITE_OTHER): Payer: BLUE CROSS/BLUE SHIELD | Admitting: Family Medicine

## 2016-06-19 VITALS — BP 162/109 | HR 87 | Wt 267.0 lb

## 2016-06-19 DIAGNOSIS — M533 Sacrococcygeal disorders, not elsewhere classified: Secondary | ICD-10-CM

## 2016-06-19 NOTE — Patient Instructions (Signed)
Thank you for coming in today. Return as needed.  Follow up with [facility] as needed.

## 2016-06-19 NOTE — Progress Notes (Signed)
Sandra Hartman is a 55 y.o. female who presents to [facility] today for back pain. Patient notes recurrent low back pain. She has a history of SI joint dysfunction. She was seen about a year ago for right-sided SI joint dysfunction and received a an injection which worked very well until a few weeks ago. She would like repeat bilateral SI joint injections today if possible. She has a history of MS with chronic intermittent lower extremity paresthesias and weakness. She denies any new or changing symptoms recently. She denies any new or changing bowel bladder dysfunction. No fevers or chills or injury.   Past Medical History:  Diagnosis Date  . MS (multiple sclerosis) ([place])   . Obesity t   Past Surgical History:  Procedure Laterality Date  . OVARIAN CYST REMOVAL  1998   Social History  Substance Use Topics  . Smoking status: Never Smoker  . Smokeless tobacco: Never Used  . Alcohol use No     ROS:  As above   Medications: Current Outpatient Prescriptions  Medication Sig Dispense Refill  . cholecalciferol (VITAMIN D) 1000 units tablet Take 2,000 Units by mouth daily.    . Misc Natural Products (TUMERSAID PO) Take 500 mg by mouth.     No current facility-administered medications for this visit.    Allergies  Allergen Reactions  . Sleep Medicine [[facility]]      Exam:  BP (!) 162/109   Pulse 87   Wt 267 lb (121.1 kg)   LMP 09/28/2012 (Approximate)   BMI 43.09 kg/m  General: Well Developed, well nourished, and in no acute distress.  Neuro/Psych: Alert and oriented x3, extra-ocular muscles intact, able to move all 4 extremities, sensation grossly intact. Skin: Warm and dry, no rashes noted.  Respiratory: Not using accessory muscles, speaking in full sentences, trachea midline.  Cardiovascular: Pulses palpable, no extremity edema. Abdomen: Does not appear distended. MSK: Obese. Tender to palpation in SI joints  bilaterally.  Procedure: Real-time Ultrasound Guided Injection of left SI  Device: GE Logiq E  Images permanently stored and available for review in the ultrasound unit. Verbal informed consent obtained. Discussed risks and benefits of procedure. Warned about infection bleeding damage to structures skin hypopigmentation and fat atrophy among others. Patient expresses understanding and agreement Time-out conducted.  Noted no overlying erythema, induration, or other signs of local infection.  Skin prepped in a sterile fashion.  Local anesthesia: Topical Ethyl chloride.  With sterile technique and under real time ultrasound guidance: 40 mg of Kenalog and 4 mL of Marcaine injected easily.  Completed without difficulty  Pain immediately resolved suggesting accurate placement of the medication.  Advised to call if fevers/chills, erythema, induration, drainage, or persistent bleeding.  Images permanently stored and available for review in the ultrasound unit.  Impression: Technically successful ultrasound guided injection.   Procedure: Real-time Ultrasound Guided Injection of right SI  Device: GE Logiq E  Images permanently stored and available for review in the ultrasound unit. Verbal informed consent obtained. Discussed risks and benefits of procedure. Warned about infection bleeding damage to structures skin hypopigmentation and fat atrophy among others. Patient expresses understanding and agreement Time-out conducted.  Noted no overlying erythema, induration, or other signs of local infection.  Skin prepped in a sterile fashion.  Local anesthesia: Topical Ethyl chloride.  With sterile technique and under real time ultrasound guidance: 40 mg of Kenalog and 4 mL of Marcaine injected easily.  Completed without difficulty  Pain immediately resolved  suggesting accurate placement of the medication.  Advised to call if fevers/chills, erythema, induration, drainage, or  persistent bleeding.  Images permanently stored and available for review in the ultrasound unit.  Impression: Technically successful ultrasound guided injection.  Ultrasound guidance due to body habitus   No results found for this or any previous visit (from the past 48 hour(s)). No results found.    Assessment and Plan: 55 y.o. female with SI joint dysfunction and pain. Injected today. Return as needed. Follow-up with PCP.    No orders of the defined types were placed in this encounter.   Discussed warning signs or symptoms. Please see discharge instructions. Patient expresses understanding.

## 2016-09-25 ENCOUNTER — Other Ambulatory Visit: Payer: Self-pay | Admitting: Physician Assistant

## 2016-09-25 DIAGNOSIS — Z1231 Encounter for screening mammogram for malignant neoplasm of breast: Secondary | ICD-10-CM

## 2016-12-27 ENCOUNTER — Ambulatory Visit (INDEPENDENT_AMBULATORY_CARE_PROVIDER_SITE_OTHER): Payer: BLUE CROSS/BLUE SHIELD

## 2016-12-27 DIAGNOSIS — Z1231 Encounter for screening mammogram for malignant neoplasm of breast: Secondary | ICD-10-CM | POA: Diagnosis not present

## 2016-12-27 NOTE — Progress Notes (Signed)
Call pt: normal mammogram. Follow up in one year.

## 2017-04-03 ENCOUNTER — Ambulatory Visit (INDEPENDENT_AMBULATORY_CARE_PROVIDER_SITE_OTHER): Payer: BLUE CROSS/BLUE SHIELD | Admitting: Physician Assistant

## 2017-04-03 ENCOUNTER — Encounter: Payer: Self-pay | Admitting: Physician Assistant

## 2017-04-03 VITALS — BP 175/92 | HR 87 | Ht 66.0 in | Wt 241.0 lb

## 2017-04-03 DIAGNOSIS — R030 Elevated blood-pressure reading, without diagnosis of hypertension: Secondary | ICD-10-CM

## 2017-04-03 DIAGNOSIS — Z0000 Encounter for general adult medical examination without abnormal findings: Secondary | ICD-10-CM

## 2017-04-03 DIAGNOSIS — Z6838 Body mass index (BMI) 38.0-38.9, adult: Secondary | ICD-10-CM | POA: Diagnosis not present

## 2017-04-03 DIAGNOSIS — E7800 Pure hypercholesterolemia, unspecified: Secondary | ICD-10-CM | POA: Diagnosis not present

## 2017-04-03 DIAGNOSIS — Z131 Encounter for screening for diabetes mellitus: Secondary | ICD-10-CM

## 2017-04-03 NOTE — Patient Instructions (Signed)
[facility]  Keeping You Healthy  Get These Tests  Blood Pressure- Have your blood pressure checked by your healthcare provider at least once a year.  Normal blood pressure is 120/80.  Weight- Have your body mass index (BMI) calculated to screen for obesity.  BMI is a measure of body fat based on height and weight.  You can calculate your own BMI at [employer]velB[employer].it  Cholesterol- Have your cholesterol checked every year.  Diabetes- Have your blood sugar checked every year if you have high blood pressure, high cholesterol, a family history of diabetes or if you are overweight.  Pap Test - Have a pap test every 1 to 5 years if you have been sexually active.  If you are older than 65 and recent pap tests have been normal you may not need additional pap tests.  In addition, if you have had a hysterectomy  for benign disease additional pap tests are not necessary.  Mammogram-Yearly mammograms are essential for early detection of breast cancer  Screening for Colon Cancer- Colonoscopy starting at age 54. Screening may begin sooner depending on your family history and other health conditions.  Follow up colonoscopy as directed by your Gastroenterologist.  Screening for Osteoporosis- Screening begins at age 63 with bone density scanning, sooner if you are at higher risk for developing Osteoporosis.  Get these medicines  Calcium with Vitamin D- Your body requires 1200-1500 mg of Calcium a day and [id number] IU of Vitamin D a day.  You can only absorb 500 mg of Calcium at a time therefore Calcium must be taken in 2 or 3 separate doses throughout the day.  Hormones- Hormone therapy has been associated with increased risk for certain cancers and heart disease.  Talk to your healthcare provider about if you need relief from menopausal symptoms.  Aspirin- Ask your healthcare provider about taking Aspirin to prevent Heart Disease and Stroke.  Get these Immuniztions  Flu shot- Every  fall  Pneumonia shot- Once after the age of 25; if you are younger ask your healthcare provider if you need a pneumonia shot.  Tetanus- Every ten years.  Zostavax- Once after the age of 78 to prevent shingles.  Take these steps  Don't smoke- Your healthcare provider can help you quit. For tips on how to quit, ask your healthcare provider or go to www.smokefree.gov or call 1-800 QUIT-NOW.  Be physically active- Exercise 5 days a week for a minimum of 30 minutes.  If you are not already physically active, start slow and gradually work up to 30 minutes of moderate physical activity.  Try walking, dancing, bike riding, swimming, etc.  Eat a healthy diet- Eat a variety of healthy foods such as fruits, vegetables, whole grains, low fat milk, low fat cheeses, yogurt, lean meats, chicken, fish, eggs, dried beans, tofu, etc.  For more information go to www.thenutritionsource.org  Dental visit- Brush and floss teeth twice daily; visit your dentist twice a year.  Eye exam- Visit your Optometrist or Ophthalmologist yearly.  Drink alcohol in moderation- Limit alcohol intake to one drink or less a day.  Never drink and drive.  Depression- Your emotional health is as important as your physical health.  If you're feeling down or losing interest in things you normally enjoy, please talk to your healthcare provider.  Seat Belts- can save your life; always wear one  Smoke/Carbon Monoxide detectors- These detectors need to be installed on the appropriate level of your home.  Replace batteries at least once a year.  Violence- If anyone is threatening or hurting you, please tell your healthcare provider.  Living Will/ Health care power of attorney- Discuss with your healthcare provider and family.

## 2017-04-03 NOTE — Progress Notes (Signed)
Subjective:     Sandra Hartman is a 56 y.o. female and is here for a comprehensive physical exam. The patient reports no problems.  Pt has lost 30lbs on a low carb diet. She is not exercising. She is feeling much better.     Social History   Social History  . Marital status: Married    Spouse name: N/A  . Number of children: N/A  . Years of education: N/A   Occupational History  . Not on file.   Social History Main Topics  . Smoking status: Never Smoker  . Smokeless tobacco: Never Used  . Alcohol use No  . Drug use: No  . Sexual activity: Not Currently   Other Topics Concern  . Not on file   Social History Narrative  . No narrative on file   Health Maintenance  Topic Date Due  . INFLUENZA VACCINE  04/04/2017 (Originally 02/28/2017)  . HIV Screening  04/04/2017 (Originally [birth year]1975)  . TETANUS/TDAP  04/04/2026 (Originally 01/09/1979)  . PAP SMEAR  10/20/2017  . MAMMOGRAM  12/28/2018  . COLONOSCOPY  12/07/2024  . Hepatitis C Screening  Completed    The following portions of the patient's history were reviewed and updated as appropriate: allergies, current medications, past family history, past medical history, past social history, past surgical history and problem list.  Review of Systems A comprehensive review of systems was negative.   Objective:    BP (!) 175/92   Pulse 87   Ht 5\' 6"  (1.676 m)   Wt 241 lb (109.3 kg)   LMP 10/25/2012   BMI 38.90 kg/m  General appearance: alert, cooperative, appears stated age and moderately obese Head: Normocephalic, without obvious abnormality, atraumatic Eyes: conjunctivae/corneas clear. PERRL, EOM's intact. Fundi benign. Ears: normal TM's and external ear canals both ears Nose: Nares normal. Septum midline. Mucosa normal. No drainage or sinus tenderness. Throat: lips, mucosa, and tongue normal; teeth and gums normal Neck: no adenopathy, no carotid bruit, no JVD, supple, symmetrical, trachea midline and thyroid not  enlarged, symmetric, no tenderness/mass/nodules Back: symmetric, no curvature. ROM normal. No CVA tenderness. Lungs: clear to auscultation bilaterally Heart: regular rate and rhythm, S1, S2 normal, no murmur, click, rub or gallop Abdomen: soft, non-tender; bowel sounds normal; no masses,  no organomegaly Extremities: extremities normal, atraumatic, no cyanosis or edema Pulses: 2+ and symmetric Skin: Skin color, texture, turgor normal. No rashes or lesions Lymph nodes: Cervical, supraclavicular, and axillary nodes normal. Neurologic: Alert and oriented X 3, normal strength and tone. Normal symmetric reflexes. Normal coordination and gait    Assessment:    Healthy female exam.      Plan:      Marland KitchenMarland KitchenNichoel was seen today for annual exam.  Diagnoses and all orders for this visit:  Routine physical examination -     Lipid panel -     COMPLETE METABOLIC PANEL WITH GFR  Elevated LDL cholesterol level -     Lipid panel  Screening for diabetes mellitus -     COMPLETE METABOLIC PANEL WITH GFR  BMI 38.0-38.9,adult  . Depression screen PHQ 2/9 04/03/2017  Decreased Interest 0  Down, Depressed, Hopeless 0  PHQ - 2 Score 0    Labs ordered.  Mammogram up to date.  Colonoscopy up date.  No need for pap due to hysterectomy.  Continue to work on weight loss.  Discussed low carb diet with 1500 calories and 80g of protein.  Exercising at least 150 minutes a week.  [employer]  could be a Microbiologist.   Pt checks BP at home and always under 140/90. She is asymptomatic.   See After Visit Summary for Counseling Recommendations

## 2017-04-04 LAB — LIPID PANEL
CHOL/HDL RATIO: 4.9 ratio (ref <5.0)
Cholesterol: 236 mg/dL — ABNORMAL HIGH (ref <200)
HDL: 48 mg/dL — AB (ref >50)
LDL Cholesterol: 157 mg/dL — ABNORMAL HIGH (ref <100)
TRIGLYCERIDES: 155 mg/dL — AB (ref <150)
VLDL: 31 mg/dL — AB (ref <30)

## 2017-04-04 LAB — COMPLETE METABOLIC PANEL WITH GFR
ALT: 12 U/L (ref 6–29)
AST: 14 U/L (ref 10–35)
Albumin: 4.6 g/dL (ref 3.6–5.1)
Alkaline Phosphatase: 59 U/L (ref 33–130)
BUN: 11 mg/dL (ref 7–25)
CALCIUM: 9.8 mg/dL (ref 8.6–10.4)
CHLORIDE: 104 mmol/L (ref 98–110)
CO2: 23 mmol/L (ref 20–32)
Creat: 0.71 mg/dL (ref 0.50–1.05)
GFR, Est African American: >89 mL/min (ref >=60)
GFR, Est Non African American: >89 mL/min (ref >=60)
Glucose, Bld: 99 mg/dL (ref 65–99)
POTASSIUM: 4.3 mmol/L (ref 3.5–5.3)
SODIUM: 140 mmol/L (ref 135–146)
Total Bilirubin: 0.6 mg/dL (ref 0.2–1.2)
Total Protein: 7.1 g/dL (ref 6.1–8.1)

## 2017-04-05 ENCOUNTER — Encounter: Payer: Self-pay | Admitting: Physician Assistant

## 2017-04-05 DIAGNOSIS — E785 Hyperlipidemia, unspecified: Secondary | ICD-10-CM | POA: Insufficient documentation

## 2017-10-23 ENCOUNTER — Ambulatory Visit (INDEPENDENT_AMBULATORY_CARE_PROVIDER_SITE_OTHER): Payer: BLUE CROSS/BLUE SHIELD | Admitting: Family Medicine

## 2017-10-23 ENCOUNTER — Ambulatory Visit (INDEPENDENT_AMBULATORY_CARE_PROVIDER_SITE_OTHER): Payer: BLUE CROSS/BLUE SHIELD

## 2017-10-23 ENCOUNTER — Encounter: Payer: Self-pay | Admitting: Family Medicine

## 2017-10-23 VITALS — BP 173/100 | HR 111 | Ht 66.0 in | Wt 258.0 lb

## 2017-10-23 DIAGNOSIS — M1611 Unilateral primary osteoarthritis, right hip: Secondary | ICD-10-CM

## 2017-10-23 DIAGNOSIS — M1612 Unilateral primary osteoarthritis, left hip: Secondary | ICD-10-CM | POA: Diagnosis not present

## 2017-10-23 DIAGNOSIS — M533 Sacrococcygeal disorders, not elsewhere classified: Secondary | ICD-10-CM

## 2017-10-23 DIAGNOSIS — R1032 Left lower quadrant pain: Secondary | ICD-10-CM

## 2017-10-23 NOTE — Patient Instructions (Signed)
Thank you for coming in today. Get xray today for hip pain.  Recheck in the near future as needed.  We may consider PT.  Call or go to the ER if you develop a large red swollen joint with extreme pain or oozing puss.     Hip Pain The hip is the joint between the upper legs and the lower pelvis. The bones, cartilage, tendons, and muscles of your hip joint support your body and allow you to move around. Hip pain can range from a minor ache to severe pain in one or both of your hips. The pain may be felt on the inside of the hip joint near the groin, or the outside near the buttocks and upper thigh. You may also have swelling or stiffness. Follow these instructions at home: Managing pain, stiffness, and swelling  If directed, apply ice to the injured area. ? Put ice in a plastic bag. ? Place a towel between your skin and the bag. ? Leave the ice on for 20 minutes, 2-3 times a day  Sleep with a pillow between your legs on your most comfortable side.  Avoid any activities that cause pain. General instructions  Take over-the-counter and prescription medicines only as told by your health care provider.  Do any exercises as told by your health care provider.  Record the following: ? How often you have hip pain. ? The location of your pain. ? What the pain feels like. ? What makes the pain worse.  Keep all follow-up visits as told by your health care provider. This is important. Contact a health care provider if:  You cannot put weight on your leg.  Your pain or swelling continues or gets worse after one week.  It gets harder to walk.  You have a fever. Get help right away if:  You fall.  You have a sudden increase in pain and swelling in your hip.  Your hip is red or swollen or very tender to touch. Summary  Hip pain can range from a minor ache to severe pain in one or both of your hips.  The pain may be felt on the inside of the hip joint near the groin, or the outside  near the buttocks and upper thigh.  Avoid any activities that cause pain.  Record how often you have hip pain, the location of the pain, what makes it worse and what it feels like. This information is not intended to replace advice given to you by your health care provider. Make sure you discuss any questions you have with your health care provider. Document Released: 01/04/2010 Document Revised: 06/19/2016 Document Reviewed: 06/19/2016 Elsevier Interactive Patient Education  [employer].

## 2017-10-23 NOTE — Progress Notes (Signed)
Sandra Hartman is a 57 y.o. female who presents to [facility] today for SI joint pain and left groin pain.   Sandra Hartman has a pertinent sports medicine history of BL SI joint dysfunction and pain. She has done well with injections in the past. The last injections were in November of 2017 which worked well until a few weeks ago. The pain has been returning BL with no injury slowly for the last few weeks. She has a history of MS but denies any new radiating pain weakness or numbness in her legs BL. She would like to try a repeat injection if able.   She also notes pain in left left groin. The pain has been present for years but has worsened recently.  She denies any injury or change in activity.  She notes the pain is worse with flexion and with rolling over in bed.  She describes the pain is moderate and does interfere with her life sometimes.  She feels well otherwise with no fevers or chills.   Past Medical History:  Diagnosis Date  . MS (multiple sclerosis) ([place])   . Obesity t   Past Surgical History:  Procedure Laterality Date  . OVARIAN CYST REMOVAL  1998   Social History   Tobacco Use  . Smoking status: Never Smoker  . Smokeless tobacco: Never Used  Substance Use Topics  . Alcohol use: No     ROS:  As above   Medications: Current Outpatient Medications  Medication Sig Dispense Refill  . cholecalciferol (VITAMIN D) 1000 units tablet Take 2,000 Units by mouth daily.    . Misc Natural Products (TUMERSAID PO) Take 500 mg by mouth.     No current facility-administered medications for this visit.    Allergies  Allergen Reactions  . Sleep Medicine [[facility]]      Exam:  BP (!) 173/100   Pulse (!) 111   Ht 5\' 6"  (1.676 m)   Wt 258 lb (117 kg)   LMP 10/25/2012   BMI 41.64 kg/m  General: Well Developed, well nourished, and in no acute distress.  Neuro/Psych: Alert and oriented x3, extra-ocular muscles intact, able to move  all 4 extremities, sensation grossly intact. Skin: Warm and dry, no rashes noted.  Respiratory: Not using accessory muscles, speaking in full sentences, trachea midline.  Cardiovascular: Pulses palpable, no extremity edema. Abdomen: Normal-appearing nondistended.  Nontender to palpation across the abdomen.  Mildly tender to palpation at the lateral aspect of the pubic symphysis in the left. MSK:  Left groin. TTP left lateral pubic bone.  Non-tender ASIS joint.  Hip motion:  Flexion normal Internal rotation with hip flexed limited to 10 deg with pain.  Normal external rotation.  Pain with FADIR test Normal FABER test.   Procedure: Real-time Ultrasound Guided Injection of left SI  Device: GE Logiq E  Images permanently stored and available for review in the ultrasound unit. Verbal informed consent obtained. Discussed risks and benefits of procedure. Warned about infection bleeding damage to structures skin hypopigmentation and fat atrophy among others. Patient expresses understanding and agreement Time-out conducted.  Noted no overlying erythema, induration, or other signs of local infection.  Skin prepped in a sterile fashion.  Local anesthesia: Topical Ethyl chloride.  With sterile technique and under real time ultrasound guidance: 40 mg of Kenalog and 4 mL of Marcaine injected easily.  Completed without difficulty  Pain immediately resolved suggesting accurate placement of the medication.  Advised to call if fevers/chills,  erythema, induration, drainage, or persistent bleeding.  Images permanently stored and available for review in the ultrasound unit.  Impression: Technically successful ultrasound guided injection.   Procedure: Real-time Ultrasound Guided Injection of right SI  Device: GE Logiq E  Images permanently stored and available for review in the ultrasound unit. Verbal informed consent obtained. Discussed risks and benefits of procedure. Warned about  infection bleeding damage to structures skin hypopigmentation and fat atrophy among others. Patient expresses understanding and agreement Time-out conducted.  Noted no overlying erythema, induration, or other signs of local infection.  Skin prepped in a sterile fashion.  Local anesthesia: Topical Ethyl chloride.  With sterile technique and under real time ultrasound guidance: 40 mg of Kenalog and 4 mL of Marcaine injected easily.  Completed without difficulty  Pain immediately resolved suggesting accurate placement of the medication.  Advised to call if fevers/chills, erythema, induration, drainage, or persistent bleeding.  Images permanently stored and available for review in the ultrasound unit.  Impression: Technically successful ultrasound guided injection.  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 No fractures.  Awaiting radiology review.      Assessment and Plan: 57 y.o. female with  BL SI joint pain s/p injections today. Pt did well in the past with injections in November of 2017.  Repeat injections today.   Left groin pain. 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 Awaiting formal radiology review. If as expected likely next step is a trial of PT followed by diagnostic and therapeutic hip injection.      Orders Placed This Encounter  Procedures  . DG HIP UNILAT WITH PELVIS 2-3 VIEWS LEFT    Standing Status:   Future    Number of Occurrences:   1    Standing Expiration Date:   12/24/2018    Order Specific Question:   Reason for Exam (SYMPTOM  OR DIAGNOSIS REQUIRED)    Answer:   eval groin pain suspect Hip djd.    Order Specific Question:   Is patient pregnant?    Answer:   No    Order Specific Question:   Preferred imaging location?    Answer:   [facility]     Order Specific Question:   Radiology Contrast Protocol - do NOT remove file path    Answer:   [email or web address]   No orders of the defined types were placed in this encounter.   Discussed warning signs or symptoms. Please see discharge instructions. Patient expresses understanding.

## 2017-10-25 ENCOUNTER — Encounter: Payer: Self-pay | Admitting: Family Medicine

## 2017-10-30 ENCOUNTER — Encounter: Payer: Self-pay | Admitting: Family Medicine

## 2017-10-30 ENCOUNTER — Ambulatory Visit (INDEPENDENT_AMBULATORY_CARE_PROVIDER_SITE_OTHER): Payer: BLUE CROSS/BLUE SHIELD | Admitting: Family Medicine

## 2017-10-30 VITALS — BP 172/93 | HR 99 | Ht 66.0 in | Wt 254.0 lb

## 2017-10-30 DIAGNOSIS — M1612 Unilateral primary osteoarthritis, left hip: Secondary | ICD-10-CM | POA: Diagnosis not present

## 2017-10-30 DIAGNOSIS — M25552 Pain in left hip: Secondary | ICD-10-CM

## 2017-10-30 DIAGNOSIS — M169 Osteoarthritis of hip, unspecified: Secondary | ICD-10-CM

## 2017-10-30 DIAGNOSIS — E669 Obesity, unspecified: Secondary | ICD-10-CM

## 2017-10-30 HISTORY — DX: Obesity, unspecified: E66.9

## 2017-10-30 HISTORY — DX: Osteoarthritis of hip, unspecified: M16.9

## 2017-10-30 NOTE — Progress Notes (Signed)
Sandra Hartman is a 57 y.o. female who presents to [facility] today for left hip pain.  Sandra Hartman notes left groin pain thought to be related to femoral acetabular joint.  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  She received bilateral SI joint injections at that visit.  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  Notes left groin pain especially with standing and rotation and has some pain getting down to her shoes.  She denies significant radiating pain weakness fevers or chills.   Past Medical History:  Diagnosis Date  . MS (multiple sclerosis) ([place])   . Obesity t   Past Surgical History:  Procedure Laterality Date  . OVARIAN CYST REMOVAL  1998   Social History   Tobacco Use  . Smoking status: Never Smoker  . Smokeless tobacco: Never Used  Substance Use Topics  . Alcohol use: No     ROS:  As above   Medications: Current Outpatient Medications  Medication Sig Dispense Refill  . cholecalciferol (VITAMIN D) 1000 units tablet Take 2,000 Units by mouth daily.    . Misc Natural Products (TUMERSAID PO) Take 500 mg by mouth.     No current facility-administered medications for this visit.    Allergies  Allergen Reactions  . Sleep Medicine [[place]]      Exam:  BP (!) 172/93   Pulse 99   Ht 5\' 6"  (1.676 m)   Wt 254 lb (115.2 kg)   LMP 10/25/2012   BMI 41.00 kg/m  General: Well Developed, well nourished, and in no acute distress.  Neuro/Psych: Alert and oriented x3, extra-ocular muscles intact, able to move all 4 extremities, sensation grossly intact. Skin: Warm and dry, no rashes noted.  Respiratory: Not using accessory muscles, speaking in full sentences, trachea midline.  Cardiovascular: Pulses palpable, no extremity edema. Abdomen: Does not appear distended. MSK:  Left hip normal-appearing. Decreased rotational  range of motion especially internal rotation limited by pain.  Procedure: Real-time Ultrasound Guided Injection of left femoral acetabular joint Device: GE Logiq E   Images permanently stored and available for review in the ultrasound unit. Verbal informed consent obtained.  Discussed risks and benefits of procedure. Warned about infection bleeding damage to structures skin hypopigmentation and fat atrophy among others. Patient expresses understanding and agreement Time-out conducted.   Noted no overlying erythema, induration, or other signs of local infection.   Skin prepped in a sterile fashion.   Local anesthesia: Topical Ethyl chloride.   With sterile technique and under real time ultrasound guidance:  80 mg of Depo-Medrol, and 4 mm of Marcaine injected easily.   Completed without difficulty   Pain immediately resolved suggesting accurate placement of the medication.   Advised to call if fevers/chills, erythema, induration, drainage, or persistent bleeding.   Images permanently stored and available for review in the ultrasound unit.  Impression: Technically successful ultrasound guided injection.      Assessment and Plan: 57 y.o. female with left hip pain.  Patient is status post successful diagnostic and therapeutic left hip injection today.  She had considerable pain relief immediately following hip injection indicating that the source of her pain is very likely her femoral acetabular joint.  Her x-ray findings are concerning for femoral acetabular impingement as well as DJD.  We discussed the poor treatment options for femoral acetabular impingement in the setting of hip  DJD.  Plan for weight loss and core strengthening.  Hopeful that the Depo-Medrol component of the injection will provide good lasting pain relief.  Recheck if not improving or if worsening.    No orders of the defined types were placed in this encounter.  No orders of the defined types were placed in this  encounter.   Discussed warning signs or symptoms. Please see discharge instructions. Patient expresses understanding.

## 2017-10-30 NOTE — Patient Instructions (Addendum)
Thank you for coming in today. Call or go to the ER if you develop a large red swollen joint with extreme pain or oozing puss.  Recheck as needed.  Let me know how you hip pain does today.  Lets hope that you get long lasting pain relief.   Research Femoral Acetabular Impingement (FAI).

## 2017-12-03 ENCOUNTER — Other Ambulatory Visit: Payer: Self-pay | Admitting: Physician Assistant

## 2017-12-03 DIAGNOSIS — Z1239 Encounter for other screening for malignant neoplasm of breast: Secondary | ICD-10-CM

## 2017-12-28 ENCOUNTER — Ambulatory Visit (INDEPENDENT_AMBULATORY_CARE_PROVIDER_SITE_OTHER): Payer: BLUE CROSS/BLUE SHIELD

## 2017-12-28 DIAGNOSIS — Z1239 Encounter for other screening for malignant neoplasm of breast: Secondary | ICD-10-CM

## 2017-12-28 DIAGNOSIS — Z1231 Encounter for screening mammogram for malignant neoplasm of breast: Secondary | ICD-10-CM

## 2017-12-28 NOTE — Progress Notes (Signed)
Call pt: normal mammogram.

## 2017-12-31 DIAGNOSIS — K136 Irritative hyperplasia of oral mucosa: Secondary | ICD-10-CM | POA: Diagnosis not present

## 2018-01-15 ENCOUNTER — Encounter: Payer: Self-pay | Admitting: Family Medicine

## 2018-06-24 ENCOUNTER — Encounter: Payer: BLUE CROSS/BLUE SHIELD | Admitting: Physician Assistant

## 2018-06-24 ENCOUNTER — Ambulatory Visit (INDEPENDENT_AMBULATORY_CARE_PROVIDER_SITE_OTHER): Payer: BLUE CROSS/BLUE SHIELD | Admitting: Physician Assistant

## 2018-06-24 ENCOUNTER — Encounter: Payer: Self-pay | Admitting: Physician Assistant

## 2018-06-24 VITALS — BP 151/95 | HR 93 | Temp 98.4°F | Ht 66.0 in | Wt 253.0 lb

## 2018-06-24 DIAGNOSIS — G35 Multiple sclerosis: Secondary | ICD-10-CM

## 2018-06-24 DIAGNOSIS — Z0000 Encounter for general adult medical examination without abnormal findings: Secondary | ICD-10-CM | POA: Diagnosis not present

## 2018-06-24 DIAGNOSIS — M533 Sacrococcygeal disorders, not elsewhere classified: Secondary | ICD-10-CM

## 2018-06-24 DIAGNOSIS — Z13220 Encounter for screening for lipoid disorders: Secondary | ICD-10-CM

## 2018-06-24 DIAGNOSIS — M1612 Unilateral primary osteoarthritis, left hip: Secondary | ICD-10-CM

## 2018-06-24 DIAGNOSIS — E559 Vitamin D deficiency, unspecified: Secondary | ICD-10-CM | POA: Diagnosis not present

## 2018-06-24 DIAGNOSIS — Z131 Encounter for screening for diabetes mellitus: Secondary | ICD-10-CM | POA: Diagnosis not present

## 2018-06-24 DIAGNOSIS — G35D Multiple sclerosis, unspecified: Secondary | ICD-10-CM

## 2018-06-24 DIAGNOSIS — Z780 Asymptomatic menopausal state: Secondary | ICD-10-CM | POA: Insufficient documentation

## 2018-06-24 MED ORDER — NAPROXEN 500 MG PO TABS: 500.0000 mg | ORAL_TABLET | Freq: Two times a day (BID) | ORAL | 3 refills | Status: AC

## 2018-06-24 MED ORDER — TRAMADOL HCL 50 MG PO TABS: ORAL_TABLET | ORAL | 0 refills | Status: DC

## 2018-06-24 MED ORDER — CYCLOBENZAPRINE HCL 10 MG PO TABS: 10.0000 mg | ORAL_TABLET | Freq: Three times a day (TID) | ORAL | 5 refills | Status: DC | PRN

## 2018-06-24 NOTE — Patient Instructions (Addendum)
Health Maintenance for Postmenopausal Women Menopause is a normal process in which your reproductive ability comes to an end. This process happens gradually over a span of months to years, usually between the ages of 22 and 9. Menopause is complete when you have missed 12 consecutive menstrual periods. It is important to talk with your health care provider about some of the most common conditions that affect postmenopausal women, such as heart disease, cancer, and bone loss (osteoporosis). Adopting a healthy lifestyle and getting preventive care can help to promote your health and wellness. Those actions can also lower your chances of developing some of these common conditions. What should I know about menopause? During menopause, you may experience a number of symptoms, such as:  Moderate-to-severe hot flashes.  Night sweats.  Decrease in sex drive.  Mood swings.  Headaches.  Tiredness.  Irritability.  Memory problems.  Insomnia.  Choosing to treat or not to treat menopausal changes is an individual decision that you make with your health care provider. What should I know about hormone replacement therapy and supplements? Hormone therapy products are effective for treating symptoms that are associated with menopause, such as hot flashes and night sweats. Hormone replacement carries certain risks, especially as you become older. If you are thinking about using estrogen or estrogen with progestin treatments, discuss the benefits and risks with your health care provider. What should I know about heart disease and stroke? Heart disease, heart attack, and stroke become more likely as you age. This may be due, in part, to the hormonal changes that your body experiences during menopause. These can affect how your body processes dietary fats, triglycerides, and cholesterol. Heart attack and stroke are both medical emergencies. There are many things that you can do to help prevent heart disease  and stroke:  Have your blood pressure checked at least every 1-2 years. High blood pressure causes heart disease and increases the risk of stroke.  If you are 53-22 years old, ask your health care provider if you should take aspirin to prevent a heart attack or a stroke.  Do not use any tobacco products, including cigarettes, chewing tobacco, or electronic cigarettes. If you need help quitting, ask your health care provider.  It is important to eat a healthy diet and maintain a healthy weight. ? Be sure to include plenty of vegetables, fruits, low-fat dairy products, and lean protein. ? Avoid eating foods that are high in solid fats, added sugars, or salt (sodium).  Get regular exercise. This is one of the most important things that you can do for your health. ? Try to exercise for at least 150 minutes each week. The type of exercise that you do should increase your heart rate and make you sweat. This is known as moderate-intensity exercise. ? Try to do strengthening exercises at least twice each week. Do these in addition to the moderate-intensity exercise.  Know your numbers.Ask your health care provider to check your cholesterol and your blood glucose. Continue to have your blood tested as directed by your health care provider.  What should I know about cancer screening? There are several types of cancer. Take the following steps to reduce your risk and to catch any cancer development as early as possible. Breast Cancer  Practice breast self-awareness. ? This means understanding how your breasts normally appear and feel. ? It also means doing regular breast self-exams. Let your health care provider know about any changes, no matter how small.  If you are 40  or older, have a clinician do a breast exam (clinical breast exam or CBE) every year. Depending on your age, family history, and medical history, it may be recommended that you also have a yearly breast X-ray (mammogram).  If you  have a family history of breast cancer, talk with your health care provider about genetic screening.  If you are at high risk for breast cancer, talk with your health care provider about having an MRI and a mammogram every year.  Breast cancer (BRCA) gene test is recommended for women who have family members with BRCA-related cancers. Results of the assessment will determine the need for genetic counseling and BRCA1 and for BRCA2 testing. BRCA-related cancers include these types: ? Breast. This occurs in males or females. ? Ovarian. ? Tubal. This may also be called fallopian tube cancer. ? Cancer of the abdominal or pelvic lining (peritoneal cancer). ? Prostate. ? Pancreatic.  Cervical, Uterine, and Ovarian Cancer Your health care provider may recommend that you be screened regularly for cancer of the pelvic organs. These include your ovaries, uterus, and vagina. This screening involves a pelvic exam, which includes checking for microscopic changes to the surface of your cervix (Pap test).  For women ages 21-65, health care providers may recommend a pelvic exam and a Pap test every three years. For women ages 79-65, they may recommend the Pap test and pelvic exam, combined with testing for human papilloma virus (HPV), every five years. Some types of HPV increase your risk of cervical cancer. Testing for HPV may also be done on women of any age who have unclear Pap test results.  Other health care providers may not recommend any screening for nonpregnant women who are considered low risk for pelvic cancer and have no symptoms. Ask your health care provider if a screening pelvic exam is right for you.  If you have had past treatment for cervical cancer or a condition that could lead to cancer, you need Pap tests and screening for cancer for at least 20 years after your treatment. If Pap tests have been discontinued for you, your risk factors (such as having a new sexual partner) need to be  reassessed to determine if you should start having screenings again. Some women have medical problems that increase the chance of getting cervical cancer. In these cases, your health care provider may recommend that you have screening and Pap tests more often.  If you have a family history of uterine cancer or ovarian cancer, talk with your health care provider about genetic screening.  If you have vaginal bleeding after reaching menopause, tell your health care provider.  There are currently no reliable tests available to screen for ovarian cancer.  Lung Cancer Lung cancer screening is recommended for adults 69-62 years old who are at high risk for lung cancer because of a history of smoking. A yearly low-dose CT scan of the lungs is recommended if you:  Currently smoke.  Have a history of at least 30 pack-years of smoking and you currently smoke or have quit within the past 15 years. A pack-year is smoking an average of one pack of cigarettes per day for one year.  Yearly screening should:  Continue until it has been 15 years since you quit.  Stop if you develop a health problem that would prevent you from having lung cancer treatment.  Colorectal Cancer  This type of cancer can be detected and can often be prevented.  Routine colorectal cancer screening usually begins at  age 42 and continues through age 45.  If you have risk factors for colon cancer, your health care provider may recommend that you be screened at an earlier age.  If you have a family history of colorectal cancer, talk with your health care provider about genetic screening.  Your health care provider may also recommend using home test kits to check for hidden blood in your stool.  A small camera at the end of a tube can be used to examine your colon directly (sigmoidoscopy or colonoscopy). This is done to check for the earliest forms of colorectal cancer.  Direct examination of the colon should be repeated every  5-10 years until age 71. However, if early forms of precancerous polyps or small growths are found or if you have a family history or genetic risk for colorectal cancer, you may need to be screened more often.  Skin Cancer  Check your skin from head to toe regularly.  Monitor any moles. Be sure to tell your health care provider: ? About any new moles or changes in moles, especially if there is a change in a mole's shape or color. ? If you have a mole that is larger than the size of a pencil eraser.  If any of your family members has a history of skin cancer, especially at a young age, talk with your health care provider about genetic screening.  Always use sunscreen. Apply sunscreen liberally and repeatedly throughout the day.  Whenever you are outside, protect yourself by wearing long sleeves, pants, a wide-brimmed hat, and sunglasses.  What should I know about osteoporosis? Osteoporosis is a condition in which bone destruction happens more quickly than new bone creation. After menopause, you may be at an increased risk for osteoporosis. To help prevent osteoporosis or the bone fractures that can happen because of osteoporosis, the following is recommended:  If you are 46-71 years old, get at least 1,000 mg of calcium and at least 600 mg of vitamin D per day.  If you are older than age 55 but younger than age 65, get at least 1,200 mg of calcium and at least 600 mg of vitamin D per day.  If you are older than age 54, get at least 1,200 mg of calcium and at least 800 mg of vitamin D per day.  Smoking and excessive alcohol intake increase the risk of osteoporosis. Eat foods that are rich in calcium and vitamin D, and do weight-bearing exercises several times each week as directed by your health care provider. What should I know about how menopause affects my mental health? Depression may occur at any age, but it is more common as you become older. Common symptoms of depression  include:  Low or sad mood.  Changes in sleep patterns.  Changes in appetite or eating patterns.  Feeling an overall lack of motivation or enjoyment of activities that you previously enjoyed.  Frequent crying spells.  Talk with your health care provider if you think that you are experiencing depression. What should I know about immunizations? It is important that you get and maintain your immunizations. These include:  Tetanus, diphtheria, and pertussis (Tdap) booster vaccine.  Influenza every year before the flu season begins.  Pneumonia vaccine.  Shingles vaccine.  Your health care provider may also recommend other immunizations. This information is not intended to replace advice given to you by your health care provider. Make sure you discuss any questions you have with your health care provider. Document Released: 09/08/2005  Document Revised: 02/04/2016 Document Reviewed: 04/20/2015 Elsevier Interactive Patient Education  2018 [employer].  Consider shingles vaccine: shingrix.

## 2018-06-24 NOTE — Progress Notes (Signed)
Subjective:     Sandra Hartman is a 57 y.o. female and is here for a comprehensive physical exam. The patient reports no problems.  Pt does have intermittent low left back/buttock pain from her SI joint. She is managing with flexeril, naproxen. She request some refills and as needed tramadol for really bad nights.     Social History   Socioeconomic History  . Marital status: Single    Spouse name: Not on file  . Number of children: Not on file  . Years of education: Not on file  . Highest education level: Not on file  Occupational History  . Not on file  Social Needs  . Financial resource strain: Not on file  . Food insecurity:    Worry: Not on file    Inability: Not on file  . Transportation needs:    Medical: Not on file    Non-medical: Not on file  Tobacco Use  . Smoking status: Never Smoker  . Smokeless tobacco: Never Used  Substance and Sexual Activity  . Alcohol use: No  . Drug use: No  . Sexual activity: Not Currently  Lifestyle  . Physical activity:    Days per week: Not on file    Minutes per session: Not on file  . Stress: Not on file  Relationships  . Social connections:    Talks on phone: Not on file    Gets together: Not on file    Attends religious service: Not on file    Active member of club or organization: Not on file    Attends meetings of clubs or organizations: Not on file    Relationship status: Not on file  . Intimate partner violence:    Fear of current or ex partner: Not on file    Emotionally abused: Not on file    Physically abused: Not on file    Forced sexual activity: Not on file  Other Topics Concern  . Not on file  Social History Narrative  . Not on file   Health Maintenance  Topic Date Due  . HIV Screening  10/24/2018 (Originally 01/09/1976)  . INFLUENZA VACCINE  04/01/2019 (Originally 02/28/2018)  . TETANUS/TDAP  04/04/2026 (Originally 01/09/1979)  . MAMMOGRAM  12/29/2019  . COLONOSCOPY  12/07/2024  . Hepatitis C Screening   Completed    The following portions of the patient's history were reviewed and updated as appropriate: allergies, current medications, past family history, past medical history, past social history, past surgical history and problem list.  Review of Systems A comprehensive review of systems was negative.   Objective:    BP (!) 151/95   Pulse 93   Temp 98.4 F (36.9 C) (Oral)   Ht 5\' 6"  (1.676 m)   Wt 253 lb (114.8 kg)   LMP 10/25/2012   BMI 40.84 kg/m  General appearance: alert, cooperative, appears stated age, moderately obese and morbidly obese Head: Normocephalic, without obvious abnormality, atraumatic Eyes: conjunctivae/corneas clear. PERRL, EOM's intact. Fundi benign. Ears: normal TM's and external ear canals both ears Nose: Nares normal. Septum midline. Mucosa normal. No drainage or sinus tenderness. Throat: lips, mucosa, and tongue normal; teeth and gums normal Neck: no adenopathy, no carotid bruit, no JVD, supple, symmetrical, trachea midline and thyroid not enlarged, symmetric, no tenderness/mass/nodules Back: symmetric, no curvature. ROM normal. No CVA tenderness. Lungs: clear to auscultation bilaterally Heart: regular rate and rhythm, S1, S2 normal, no murmur, click, rub or gallop Abdomen: soft, non-tender; bowel sounds normal; no masses,  no organomegaly Extremities: extremities normal, atraumatic, no cyanosis or edema Pulses: 2+ and symmetric Skin: Skin color, texture, turgor normal. No rashes or lesions Lymph nodes: Cervical, supraclavicular, and axillary nodes normal. Neurologic: Alert and oriented X 3, normal strength and tone. Normal symmetric reflexes. Normal coordination and gait    Assessment:    Healthy female exam.      Plan:    Marland KitchenMarland KitchenMonseratt was seen today for annual exam.  Diagnoses and all orders for this visit:  Routine physical examination -     Lipid Panel w/reflex Direct LDL -     COMPLETE METABOLIC PANEL WITH GFR  MS (multiple sclerosis)  ([place])  Screening for diabetes mellitus -     COMPLETE METABOLIC PANEL WITH GFR  Screening for lipid disorders -     Lipid Panel w/reflex Direct LDL  Post-menopausal -     VITAMIN D 25 Hydroxy (Vit-D Deficiency, Fractures)  Vitamin D deficiency -     VITAMIN D 25 Hydroxy (Vit-D Deficiency, Fractures)  Sacroiliac joint dysfunction of left side -     traMADol (ULTRAM) 50 MG tablet; Take 1-2 tablets every 6 hours as needed for moderate pain. -     naproxen (NAPROSYN) 500 MG tablet; Take 1 tablet (500 mg total) by mouth 2 (two) times daily with a meal. -     cyclobenzaprine (FLEXERIL) 10 MG tablet; Take 1 tablet (10 mg total) by mouth 3 (three) times daily as needed for muscle spasms.  Primary osteoarthritis of left hip -     traMADol (ULTRAM) 50 MG tablet; Take 1-2 tablets every 6 hours as needed for moderate pain. -     naproxen (NAPROSYN) 500 MG tablet; Take 1 tablet (500 mg total) by mouth 2 (two) times daily with a meal. -     cyclobenzaprine (FLEXERIL) 10 MG tablet; Take 1 tablet (10 mg total) by mouth 3 (three) times daily as needed for muscle spasms.   .. Depression screen [facility] 2/9 06/24/2018 10/23/2017 04/03/2017  Decreased Interest 0 0 0  Down, Depressed, Hopeless 0 0 0  PHQ - 2 Score 0 0 0  Altered sleeping 0 0 -  Tired, decreased energy 0 0 -  Change in appetite 0 0 -  Feeling bad or failure about yourself  0 0 -  Trouble concentrating 0 0 -  Moving slowly or fidgety/restless 0 0 -  Suicidal thoughts 0 0 -  PHQ-9 Score 0 0 -  Difficult doing work/chores Not difficult at all - -   .Marland Kitchen GAD 7 : Generalized Anxiety Score 06/24/2018  Nervous, Anxious, on Edge 0  Control/stop worrying 0  Worry too much - different things 0  Trouble relaxing 0  Restless 0  Easily annoyed or irritable 0  Afraid - awful might happen 0  Total GAD 7 Score 0  Anxiety Difficulty Not difficult at all    .Marland Kitchen Discussed 150 minutes of exercise a week.  Encouraged vitamin D 1000 units and  Calcium 1300mg  or 4 servings of dairy a day.  Mammogram done 11/2017.  No need for pap. Colonoscopy up to date.  Pt declined flu shot.  Discussed shingrix but declined for now. HO given.   Discussed management of her SI joint. Encouraged heat with tens unit, and massage therapy. Certainly follow up with Dr. Georgina Snell to consider more injections. PT may be a good idea in the future as well.   Flexeril, naprosyn, tramadol given to use as needed.  controlled substance database reviewed with no  concerns.  See After Visit Summary for Counseling Recommendations

## 2018-06-25 LAB — COMPLETE METABOLIC PANEL WITH GFR
AG RATIO: 1.7 (calc) (ref 1.0–2.5)
ALKALINE PHOSPHATASE (APISO): 66 U/L (ref 33–130)
ALT: 13 U/L (ref 6–29)
AST: 14 U/L (ref 10–35)
Albumin: 4.5 g/dL (ref 3.6–5.1)
BUN: 12 mg/dL (ref 7–25)
CO2: 23 mmol/L (ref 20–32)
Calcium: 10.2 mg/dL (ref 8.6–10.4)
Chloride: 105 mmol/L (ref 98–110)
Creat: 0.76 mg/dL (ref 0.50–1.05)
GFR, Est African American: 100 mL/min/{1.73_m2} (ref >=60)
GFR, Est Non African American: 86 mL/min/{1.73_m2} (ref >=60)
GLOBULIN: 2.6 g/dL (ref 1.9–3.7)
Glucose, Bld: 88 mg/dL (ref 65–99)
POTASSIUM: 4.2 mmol/L (ref 3.5–5.3)
SODIUM: 141 mmol/L (ref 135–146)
Total Bilirubin: 0.6 mg/dL (ref 0.2–1.2)
Total Protein: 7.1 g/dL (ref 6.1–8.1)

## 2018-06-25 LAB — VITAMIN D 25 HYDROXY (VIT D DEFICIENCY, FRACTURES): VIT D 25 HYDROXY: 36 ng/mL (ref 30–100)

## 2018-06-25 LAB — LIPID PANEL W/REFLEX DIRECT LDL
CHOL/HDL RATIO: 5.7 (calc) — AB (ref <5.0)
Cholesterol: 266 mg/dL — ABNORMAL HIGH (ref <200)
HDL: 47 mg/dL — ABNORMAL LOW (ref >50)
LDL CHOLESTEROL (CALC): 187 mg/dL — AB
NON-HDL CHOLESTEROL (CALC): 219 mg/dL — AB (ref <130)
Triglycerides: 170 mg/dL — ABNORMAL HIGH (ref <150)

## 2018-06-25 NOTE — Progress Notes (Signed)
Call pt: vitamin D is low normal. You are certainly ok to increase vitamin D 3,000 units a day and make sure taking with protein/fat because you will absorb more due to being a fat soluble vitamin.   Kidney, liver, glucose great.   LDL increased again from 1 year ago. 157 to 187. Do lower CV risk we need to start a statin. HDL still low, TG a little elevated. What are your thoughts?

## 2018-08-22 ENCOUNTER — Encounter: Payer: Self-pay | Admitting: Physician Assistant

## 2019-01-07 ENCOUNTER — Other Ambulatory Visit: Payer: Self-pay | Admitting: Physician Assistant

## 2019-01-07 DIAGNOSIS — Z1231 Encounter for screening mammogram for malignant neoplasm of breast: Secondary | ICD-10-CM

## 2019-01-30 ENCOUNTER — Other Ambulatory Visit: Payer: Self-pay

## 2019-01-30 ENCOUNTER — Ambulatory Visit (INDEPENDENT_AMBULATORY_CARE_PROVIDER_SITE_OTHER): Payer: BC Managed Care – PPO

## 2019-01-30 DIAGNOSIS — Z1231 Encounter for screening mammogram for malignant neoplasm of breast: Secondary | ICD-10-CM

## 2019-02-02 NOTE — Progress Notes (Signed)
Normal mammogram follow up in 1 year.

## 2019-06-18 ENCOUNTER — Telehealth: Payer: Self-pay | Admitting: Physician Assistant

## 2019-06-18 DIAGNOSIS — Z13220 Encounter for screening for lipoid disorders: Secondary | ICD-10-CM

## 2019-06-18 DIAGNOSIS — E559 Vitamin D deficiency, unspecified: Secondary | ICD-10-CM

## 2019-06-18 DIAGNOSIS — Z0000 Encounter for general adult medical examination without abnormal findings: Secondary | ICD-10-CM

## 2019-06-18 NOTE — Telephone Encounter (Signed)
Please let me know what labs need to be ordered? Thanks

## 2019-06-18 NOTE — Telephone Encounter (Signed)
Lipid, cmp, cbc, vitamin D, TSH

## 2019-06-18 NOTE — Telephone Encounter (Signed)
Patient has Physical with PCP on 06/25/19 and wants a Lab order put in to have labs done prior to this appt.

## 2019-06-18 NOTE — Telephone Encounter (Signed)
Please advise 

## 2019-06-19 NOTE — Telephone Encounter (Signed)
Labs ordered per PCP recommendation. Patient aware she can get labs before her visit. No other questions.

## 2019-06-24 ENCOUNTER — Encounter: Payer: BC Managed Care – PPO | Admitting: Physician Assistant

## 2019-06-25 ENCOUNTER — Ambulatory Visit (INDEPENDENT_AMBULATORY_CARE_PROVIDER_SITE_OTHER): Payer: BC Managed Care – PPO | Admitting: Physician Assistant

## 2019-06-25 ENCOUNTER — Other Ambulatory Visit: Payer: Self-pay

## 2019-06-25 VITALS — BP 164/81 | HR 98 | Ht 66.0 in | Wt 274.0 lb

## 2019-06-25 DIAGNOSIS — R030 Elevated blood-pressure reading, without diagnosis of hypertension: Secondary | ICD-10-CM

## 2019-06-25 DIAGNOSIS — K439 Ventral hernia without obstruction or gangrene: Secondary | ICD-10-CM

## 2019-06-25 DIAGNOSIS — Z0000 Encounter for general adult medical examination without abnormal findings: Secondary | ICD-10-CM | POA: Diagnosis not present

## 2019-06-25 DIAGNOSIS — Z6841 Body Mass Index (BMI) 40.0 and over, adult: Secondary | ICD-10-CM

## 2019-06-25 DIAGNOSIS — E559 Vitamin D deficiency, unspecified: Secondary | ICD-10-CM | POA: Diagnosis not present

## 2019-06-25 DIAGNOSIS — G35 Multiple sclerosis: Secondary | ICD-10-CM

## 2019-06-25 DIAGNOSIS — H53411 Scotoma involving central area, right eye: Secondary | ICD-10-CM

## 2019-06-25 DIAGNOSIS — Z13220 Encounter for screening for lipoid disorders: Secondary | ICD-10-CM | POA: Diagnosis not present

## 2019-06-25 LAB — CBC WITH DIFFERENTIAL/PLATELET
Absolute Monocytes: 437 cells/uL (ref 200–950)
Basophils Absolute: 67 cells/uL (ref 0–200)
Basophils Relative: 0.8 %
Eosinophils Absolute: 202 cells/uL (ref 15–500)
Eosinophils Relative: 2.4 %
HCT: 40.5 % (ref 35.0–45.0)
Hemoglobin: 14.1 g/dL (ref 11.7–15.5)
Lymphs Abs: 1966 cells/uL (ref 850–3900)
MCH: 32.7 pg (ref 27.0–33.0)
MCHC: 34.8 g/dL (ref 32.0–36.0)
MCV: 94 fL (ref 80.0–100.0)
MPV: 11.1 fL (ref 7.5–12.5)
Monocytes Relative: 5.2 %
Neutro Abs: 5729 cells/uL (ref 1500–7800)
Neutrophils Relative %: 68.2 %
Platelets: 305 10*3/uL (ref 140–400)
RBC: 4.31 10*6/uL (ref 3.80–5.10)
RDW: 12.3 % (ref 11.0–15.0)
Total Lymphocyte: 23.4 %
WBC: 8.4 10*3/uL (ref 3.8–10.8)

## 2019-06-25 MED ORDER — PREDNISONE 50 MG PO TABS: ORAL_TABLET | ORAL | 0 refills | Status: DC

## 2019-06-25 NOTE — Patient Instructions (Addendum)
Ventral Hernia  A ventral hernia is a bulge of tissue from inside the abdomen that pushes through a weak area of the muscles that form the front wall of the abdomen. The tissues inside the abdomen are inside a sac (peritoneum). These tissues include the small intestine, large intestine, and the fatty tissue that covers the intestines (omentum). Sometimes, the bulge that forms a hernia contains intestines. Other hernias contain only fat. Ventral hernias do not go away without surgical treatment. There are several types of ventral hernias. You may have:  A hernia at an incision site from previous abdominal surgery (incisional hernia).  A hernia just above the belly button (epigastric hernia), or at the belly button (umbilical hernia). These types of hernias can develop from heavy lifting or straining.  A hernia that comes and goes (reducible hernia). It may be visible only when you lift or strain. This type of hernia can be pushed back into the abdomen (reduced).  A hernia that traps abdominal tissue inside the hernia (incarcerated hernia). This type of hernia does not reduce.  A hernia that cuts off blood flow to the tissues inside the hernia (strangulated hernia). The tissues can start to die if this happens. This is a very painful bulge that cannot be reduced. A strangulated hernia is a medical emergency. What are the causes? This condition is caused by abdominal tissue putting pressure on an area of weakness in the abdominal muscles. What increases the risk? The following factors may make you more likely to develop this condition:  Being female.  Being 19 or older.  Being overweight or obese.  Having had previous abdominal surgery, especially if there was an infection after surgery.  Having had an injury to the abdominal wall.  Having had several pregnancies.  Having a buildup of fluid inside the abdomen (ascites). What are the signs or symptoms? The only symptom of a ventral  hernia may be a painless bulge in the abdomen. A reducible hernia may be visible only when you strain, cough, or lift. Other symptoms may include:  Dull pain.  A feeling of pressure. Signs and symptoms of a strangulated hernia may include:  Increasing pain.  Nausea and vomiting.  Pain when pressing on the hernia.  The skin over the hernia turning red or purple.  Constipation.  Blood in the stool (feces). How is this diagnosed? This condition may be diagnosed based on:  Your symptoms.  Your medical history.  A physical exam. You may be asked to cough or strain while standing. These actions increase the pressure inside your abdomen and force the hernia through the opening in your muscles. Your health care provider may try to reduce the hernia by pressing on it.  Imaging studies, such as an ultrasound or CT scan. How is this treated? This condition is treated with surgery. If you have a strangulated hernia, surgery is done as soon as possible. If your hernia is small and not incarcerated, you may be asked to lose some weight before surgery. Follow these instructions at home:  Follow instructions from your health care provider about eating or drinking restrictions.  If you are overweight, your health care provider may recommend that you increase your activity level and eat a healthier diet.  Do not lift anything that is heavier than 10 lb (4.5 kg).  Return to your normal activities as told by your health care provider. Ask your health care provider what activities are safe for you. You may need to  avoid activities that increase pressure on your hernia.  Take over-the-counter and prescription medicines only as told by your health care provider.  Keep all follow-up visits as told by your health care provider. This is important. Contact a health care provider if:  Your hernia gets larger.  Your hernia becomes painful. Get help right away if:  Your hernia becomes  increasingly painful.  You have pain along with any of the following: ? Changes in skin color in the area of the hernia. ? Nausea. ? Vomiting. ? Fever. Summary  A ventral hernia is a bulge of tissue from inside the abdomen that pushes through a weak area of the muscles that form the front wall of the abdomen.  This condition is treated with surgery, which may be urgent depending on your hernia.  Do not lift anything that is heavier than 10 lb (4.5 kg), and follow activity instructions from your health care provider. This information is not intended to replace advice given to you by your health care provider. Make sure you discuss any questions you have with your health care provider. Document Released: 07/03/2012 Document Revised: 08/29/2017 Document Reviewed: 02/05/2017 Elsevier Patient Education  2020 [place] Maintenance, Female Adopting a healthy lifestyle and getting preventive care are important in promoting health and wellness. Ask your health care provider about:  The right schedule for you to have regular tests and exams.  Things you can do on your own to prevent diseases and keep yourself healthy. What should I know about diet, weight, and exercise? Eat a healthy diet   Eat a diet that includes plenty of vegetables, fruits, low-fat dairy products, and lean protein.  Do not eat a lot of foods that are high in solid fats, added sugars, or sodium. Maintain a healthy weight Body mass index (BMI) is used to identify weight problems. It estimates body fat based on height and weight. Your health care provider can help determine your BMI and help you achieve or maintain a healthy weight. Get regular exercise Get regular exercise. This is one of the most important things you can do for your health. Most adults should:  Exercise for at least 150 minutes each week. The exercise should increase your heart rate and make you sweat (moderate-intensity exercise).  Do  strengthening exercises at least twice a week. This is in addition to the moderate-intensity exercise.  Spend less time sitting. Even light physical activity can be beneficial. Watch cholesterol and blood lipids Have your blood tested for lipids and cholesterol at 59 years of age, then have this test every 5 years. Have your cholesterol levels checked more often if:  Your lipid or cholesterol levels are high.  You are older than 59 years of age.  You are at high risk for heart disease. What should I know about cancer screening? Depending on your health history and family history, you may need to have cancer screening at various ages. This may include screening for:  Breast cancer.  Cervical cancer.  Colorectal cancer.  Skin cancer.  Lung cancer. What should I know about heart disease, diabetes, and high blood pressure? Blood pressure and heart disease  High blood pressure causes heart disease and increases the risk of stroke. This is more likely to develop in people who have high blood pressure readings, are of African descent, or are overweight.  Have your blood pressure checked: ? Every 3-5 years if you are 43-44 years of age. ? Every year if you are 40 years  old or older. Diabetes Have regular diabetes screenings. This checks your fasting blood sugar level. Have the screening done:  Once every three years after age 71 if you are at a normal weight and have a low risk for diabetes.  More often and at a younger age if you are overweight or have a high risk for diabetes. What should I know about preventing infection? Hepatitis B If you have a higher risk for hepatitis B, you should be screened for this virus. Talk with your health care provider to find out if you are at risk for hepatitis B infection. Hepatitis C Testing is recommended for:  Everyone born from [birth year] through 1966.  Anyone with known risk factors for hepatitis C. Sexually transmitted infections (STIs)   Get screened for STIs, including gonorrhea and chlamydia, if: ? You are sexually active and are younger than 59 years of age. ? You are older than 59 years of age and your health care provider tells you that you are at risk for this type of infection. ? Your sexual activity has changed since you were last screened, and you are at increased risk for chlamydia or gonorrhea. Ask your health care provider if you are at risk.  Ask your health care provider about whether you are at high risk for HIV. Your health care provider may recommend a prescription medicine to help prevent HIV infection. If you choose to take medicine to prevent HIV, you should first get tested for HIV. You should then be tested every 3 months for as long as you are taking the medicine. Pregnancy  If you are about to stop having your period (premenopausal) and you may become pregnant, seek counseling before you get pregnant.  Take 400 to 800 micrograms (mcg) of folic acid every day if you become pregnant.  Ask for birth control (contraception) if you want to prevent pregnancy. Osteoporosis and menopause Osteoporosis is a disease in which the bones lose minerals and strength with aging. This can result in bone fractures. If you are 27 years old or older, or if you are at risk for osteoporosis and fractures, ask your health care provider if you should:  Be screened for bone loss.  Take a calcium or vitamin D supplement to lower your risk of fractures.  Be given hormone replacement therapy (HRT) to treat symptoms of menopause. Follow these instructions at home: Lifestyle  Do not use any products that contain nicotine or tobacco, such as cigarettes, e-cigarettes, and chewing tobacco. If you need help quitting, ask your health care provider.  Do not use street drugs.  Do not share needles.  Ask your health care provider for help if you need support or information about quitting drugs. Alcohol use  Do not drink alcohol if:  ? Your health care provider tells you not to drink. ? You are pregnant, may be pregnant, or are planning to become pregnant.  If you drink alcohol: ? Limit how much you use to 0-1 drink a day. ? Limit intake if you are breastfeeding.  Be aware of how much alcohol is in your drink. In the [place] one drink equals one 12 oz bottle of beer (355 mL), one 5 oz glass of wine (148 mL), or one 1 oz glass of hard liquor (44 mL). General instructions  Schedule regular health, dental, and eye exams.  Stay current with your vaccines.  Tell your health care provider if: ? You often feel depressed. ? You have ever been abused or do  not feel safe at home. Summary  Adopting a healthy lifestyle and getting preventive care are important in promoting health and wellness.  Follow your health care provider's instructions about healthy diet, exercising, and getting tested or screened for diseases.  Follow your health care provider's instructions on monitoring your cholesterol and blood pressure. This information is not intended to replace advice given to you by your health care provider. Make sure you discuss any questions you have with your health care provider. Document Released: 01/30/2011 Document Revised: 07/10/2018 Document Reviewed: 07/10/2018 Elsevier Patient Education  2020 [employer].

## 2019-06-25 NOTE — Progress Notes (Signed)
Subjective:     Sandra Hartman is a 58 y.o. female and is here for a comprehensive physical exam. The patient reports problems - see below..  Pt is not exercising or eating right. She has gained some weight.   She has noticed some central blurry vision of right eye for over a month. She has MS. Concerned about correlation. No recent eye exam. No eye pain.   Her ventral hernia is starting to bother her. She feels more pressure in that area. She would like a Psychologist, sport and exercise to take a look at it.   Social History   Socioeconomic History  . Marital status: Single    Spouse name: Not on file  . Number of children: Not on file  . Years of education: Not on file  . Highest education level: Not on file  Occupational History  . Not on file  Social Needs  . Financial resource strain: Not on file  . Food insecurity    Worry: Not on file    Inability: Not on file  . Transportation needs    Medical: Not on file    Non-medical: Not on file  Tobacco Use  . Smoking status: Never Smoker  . Smokeless tobacco: Never Used  Substance and Sexual Activity  . Alcohol use: No  . Drug use: No  . Sexual activity: Not Currently  Lifestyle  . Physical activity    Days per week: Not on file    Minutes per session: Not on file  . Stress: Not on file  Relationships  . Social Herbalist on phone: Not on file    Gets together: Not on file    Attends religious service: Not on file    Active member of club or organization: Not on file    Attends meetings of clubs or organizations: Not on file    Relationship status: Not on file  . Intimate partner violence    Fear of current or ex partner: Not on file    Emotionally abused: Not on file    Physically abused: Not on file    Forced sexual activity: Not on file  Other Topics Concern  . Not on file  Social History Narrative  . Not on file   Health Maintenance  Topic Date Due  . INFLUENZA VACCINE  10/29/2019 (Originally 03/01/2019)  . HIV  Screening  06/24/2020 (Originally 01/09/1975)  . TETANUS/TDAP  04/04/2026 (Originally 01/09/1979)  . MAMMOGRAM  01/29/2021  . COLONOSCOPY  12/07/2024  . Hepatitis C Screening  Completed    The following portions of the patient's history were reviewed and updated as appropriate: allergies, current medications, past family history, past medical history, past social history, past surgical history and problem list.  Review of Systems Pertinent items noted in HPI and remainder of comprehensive ROS otherwise negative.   Objective:    BP (!) 164/81   Pulse 98   Ht 5\' 6"  (1.676 m)   Wt 274 lb (124.3 kg)   LMP 10/25/2012   SpO2 99%   BMI 44.22 kg/m  General appearance: alert, cooperative, appears stated age and morbidly obese Head: Normocephalic, without obvious abnormality, atraumatic Eyes: conjunctivae/corneas clear. PERRL, EOM's intact. Fundi benign. Ears: normal TM's and external ear canals both ears Nose: Nares normal. Septum midline. Mucosa normal. No drainage or sinus tenderness. Throat: lips, mucosa, and tongue normal; teeth and gums normal Neck: no adenopathy, no carotid bruit, no JVD, supple, symmetrical, trachea midline and thyroid not enlarged, symmetric, no  tenderness/mass/nodules Back: symmetric, no curvature. ROM normal. No CVA tenderness. Lungs: clear to auscultation bilaterally Heart: regular rate and rhythm, S1, S2 normal, no murmur, click, rub or gallop Abdomen: normal findings: bowel sounds normal, soft, non-tender and ventral noted hernia of abdomen and small umbilicall hernia. no pain with palpation. reducible.  and see above.  Extremities: extremities normal, atraumatic, no cyanosis or edema Pulses: 2+ and symmetric Skin: Skin color, texture, turgor normal. No rashes or lesions Lymph nodes: Cervical, supraclavicular, and axillary nodes normal. Neurologic: Alert and oriented X 3, normal strength and tone. Normal symmetric reflexes. Normal coordination and gait    .Marland Kitchen Depression screen [facility] 2/9 06/25/2019 06/24/2018 10/23/2017 04/03/2017  Decreased Interest 0 0 0 0  Down, Depressed, Hopeless 0 0 0 0  PHQ - 2 Score 0 0 0 0  Altered sleeping 0 0 0 -  Tired, decreased energy 0 0 0 -  Change in appetite 0 0 0 -  Feeling bad or failure about yourself  0 0 0 -  Trouble concentrating 0 0 0 -  Moving slowly or fidgety/restless 0 0 0 -  Suicidal thoughts 0 0 0 -  PHQ-9 Score 0 0 0 -  Difficult doing work/chores Not difficult at all Not difficult at all - -    Assessment:    Healthy female exam.      Plan:    Marland KitchenMarland KitchenJacqlynn was seen today for annual exam.  Diagnoses and all orders for this visit:  Routine physical examination  White coat syndrome without diagnosis of hypertension  MS (multiple sclerosis) ([place]) -     predniSONE (DELTASONE) 50 MG tablet; One tab PO daily for 5 days. 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 -     Ambulatory referral to Ophthalmology  Ventral hernia without obstruction or gangrene -     Ambulatory referral to General Surgery   .Marland Kitchen Discussed 150 minutes of exercise a week.  Encouraged vitamin D 1000 units and Calcium 1300mg  or 4 servings of dairy a day.  Fasting labs ordered.  MM UTD. Colonoscopy UTD.  Declined flu, tdap, shingles  .Marland KitchenDiscussed low carb diet with 1500 calories and 80g of protein.  Exercising at least 150 minutes a week.  [employer]itness Pal could be a Microbiologist.    Possible vision changes is a MS complication. Needs eye appt for complete work up to look at [facility] vs macular vs other eye complications.  Due to MS and possible correlation. Gave prednisone burst.   Made referral for general surgery for consult. Discussed warming red flag  signs. Follow up sooner if any concerns.  See After Visit Summary for Counseling Recommendations

## 2019-06-26 LAB — COMPLETE METABOLIC PANEL WITH GFR
AG Ratio: 1.6 (calc) (ref 1.0–2.5)
ALT: 19 U/L (ref 6–29)
AST: 19 U/L (ref 10–35)
Albumin: 4.4 g/dL (ref 3.6–5.1)
Alkaline phosphatase (APISO): 65 U/L (ref 37–153)
BUN: 10 mg/dL (ref 7–25)
CO2: 26 mmol/L (ref 20–32)
Calcium: 9.9 mg/dL (ref 8.6–10.4)
Chloride: 102 mmol/L (ref 98–110)
Creat: 0.95 mg/dL (ref 0.50–1.05)
GFR, Est African American: 76 mL/min/{1.73_m2} (ref >=60)
GFR, Est Non African American: 66 mL/min/{1.73_m2} (ref >=60)
Globulin: 2.8 g/dL (calc) (ref 1.9–3.7)
Glucose, Bld: 88 mg/dL (ref 65–99)
Potassium: 4.7 mmol/L (ref 3.5–5.3)
Sodium: 140 mmol/L (ref 135–146)
Total Bilirubin: 0.7 mg/dL (ref 0.2–1.2)
Total Protein: 7.2 g/dL (ref 6.1–8.1)

## 2019-06-26 LAB — LIPID PANEL
Cholesterol: 299 mg/dL — ABNORMAL HIGH (ref <200)
HDL: 52 mg/dL (ref >=50)
LDL Cholesterol (Calc): 212 mg/dL (calc) — ABNORMAL HIGH
Non-HDL Cholesterol (Calc): 247 mg/dL (calc) — ABNORMAL HIGH (ref <130)
Total CHOL/HDL Ratio: 5.8 (calc) — ABNORMAL HIGH (ref <5.0)
Triglycerides: 181 mg/dL — ABNORMAL HIGH (ref <150)

## 2019-06-26 LAB — VITAMIN D 25 HYDROXY (VIT D DEFICIENCY, FRACTURES): Vit D, 25-Hydroxy: 57 ng/mL (ref 30–100)

## 2019-06-26 LAB — TSH: TSH: 1.9 mIU/L (ref 0.40–4.50)

## 2019-06-28 ENCOUNTER — Encounter: Payer: Self-pay | Admitting: Physician Assistant

## 2019-06-28 DIAGNOSIS — H53411 Scotoma involving central area, right eye: Secondary | ICD-10-CM | POA: Insufficient documentation

## 2019-06-28 DIAGNOSIS — K439 Ventral hernia without obstruction or gangrene: Secondary | ICD-10-CM | POA: Insufficient documentation

## 2019-06-29 NOTE — Telephone Encounter (Signed)
[name],   Hemoglobin looks great. Kidney, liver, glucose look great. Normal thyroid. Vitamin D is great.  LDL has worsened.total cholesterol is almost 300. HDL is a little better. You really need to start a cholesterol lowering medication. TG a little elevated. Would you consider a statin? This would help lower your overall cardiovascular risk?   -Luvenia Starch

## 2019-06-30 NOTE — Telephone Encounter (Signed)
There is other medication that is not as potent for cholesterol  as statin that could give you some reduction in cholesterol. Are you willing to consider any of those medications?

## 2019-07-07 DIAGNOSIS — H34831 Tributary (branch) retinal vein occlusion, right eye, with macular edema: Secondary | ICD-10-CM | POA: Diagnosis not present

## 2019-07-07 DIAGNOSIS — H35341 Macular cyst, hole, or pseudohole, right eye: Secondary | ICD-10-CM | POA: Diagnosis not present

## 2019-07-09 DIAGNOSIS — H43822 Vitreomacular adhesion, left eye: Secondary | ICD-10-CM | POA: Diagnosis not present

## 2019-07-09 DIAGNOSIS — H34831 Tributary (branch) retinal vein occlusion, right eye, with macular edema: Secondary | ICD-10-CM | POA: Diagnosis not present

## 2019-08-13 DIAGNOSIS — H43823 Vitreomacular adhesion, bilateral: Secondary | ICD-10-CM | POA: Diagnosis not present

## 2019-08-13 DIAGNOSIS — H34831 Tributary (branch) retinal vein occlusion, right eye, with macular edema: Secondary | ICD-10-CM | POA: Diagnosis not present

## 2019-09-24 DIAGNOSIS — H43823 Vitreomacular adhesion, bilateral: Secondary | ICD-10-CM | POA: Diagnosis not present

## 2019-09-24 DIAGNOSIS — H34831 Tributary (branch) retinal vein occlusion, right eye, with macular edema: Secondary | ICD-10-CM | POA: Diagnosis not present

## 2019-10-23 DIAGNOSIS — H43823 Vitreomacular adhesion, bilateral: Secondary | ICD-10-CM | POA: Diagnosis not present

## 2019-10-23 DIAGNOSIS — H34831 Tributary (branch) retinal vein occlusion, right eye, with macular edema: Secondary | ICD-10-CM | POA: Diagnosis not present

## 2019-11-13 ENCOUNTER — Encounter: Payer: Self-pay | Admitting: Physician Assistant

## 2019-11-13 NOTE — Telephone Encounter (Signed)
Routing to provider  

## 2019-11-14 ENCOUNTER — Telehealth (INDEPENDENT_AMBULATORY_CARE_PROVIDER_SITE_OTHER): Payer: BC Managed Care – PPO | Admitting: Physician Assistant

## 2019-11-14 VITALS — BP 125/82 | Temp 97.8°F | Ht 66.0 in | Wt 274.0 lb

## 2019-11-14 DIAGNOSIS — M533 Sacrococcygeal disorders, not elsewhere classified: Secondary | ICD-10-CM

## 2019-11-14 DIAGNOSIS — G35 Multiple sclerosis: Secondary | ICD-10-CM | POA: Diagnosis not present

## 2019-11-14 DIAGNOSIS — M1612 Unilateral primary osteoarthritis, left hip: Secondary | ICD-10-CM | POA: Diagnosis not present

## 2019-11-14 MED ORDER — CYCLOBENZAPRINE HCL 10 MG PO TABS: 10.0000 mg | ORAL_TABLET | Freq: Three times a day (TID) | ORAL | 5 refills | Status: DC | PRN

## 2019-11-14 MED ORDER — TRAMADOL HCL 50 MG PO TABS: ORAL_TABLET | ORAL | 0 refills | Status: DC

## 2019-11-14 MED ORDER — PREDNISONE 20 MG PO TABS: ORAL_TABLET | ORAL | 0 refills | Status: DC

## 2019-11-14 NOTE — Telephone Encounter (Signed)
thanks

## 2019-11-14 NOTE — Progress Notes (Deleted)
Has MS  Recently moved (end of march) this is when symptoms started Having issues with legs - numbness (both legs), balance issues No other symptoms

## 2019-11-14 NOTE — Progress Notes (Signed)
Patient ID: Sandra Hartman, female   DOB: 1960-09-22, 59 y.o.   MRN: [id number] .Marland KitchenVirtual Visit via Telephone Note  I connected with Sandra Hartman on 11/14/2019 at 11:30 AM EDT by telephone and verified that I am speaking with the correct person using two identifiers.  Location: Patient: home Provider: clinic   I discussed the limitations, risks, security and privacy concerns of performing an evaluation and management service by telephone and the availability of in person appointments. I also discussed with the patient that there may be a patient responsible charge related to this service. The patient expressed understanding and agreed to proceed.   History of Present Illness: Patient is a 59 year old female with multiple sclerosis, SI joint pain bilaterally and degenerative left hip who calls into the clinic with worsening bilateral leg pain with radiation and numbness and tingling down both legs.  She is not currently on any medication for progression of MS.  Her symptoms started about the end of March when she was very active after moving to another home.  She was doing a lot more than she should.  She has since had some issues with balance.  She denies any bowel or bladder dysfunction.  She denies any saddle anesthesia. She has responded well to burst of prednisone in the past and was requesting that today.   .. Active Ambulatory Problems    Diagnosis Date Noted  . MS (multiple sclerosis) ([place])   . White coat syndrome without diagnosis of hypertension 06/30/2013  . Anxiety state 09/02/2014  . Fluttering muscles 09/02/2014  . Squeezing chest pain 09/06/2014  . Obesity 10/21/2014  . Family history of uterine cancer 12/05/2014  . Thickened endometrium 12/05/2014  . Bilateral lower abdominal pain 12/05/2014  . Abdominal pain, epigastric 12/05/2014  . LUQ abdominal pain 12/05/2014  . Colon polyp 12/08/2014  . 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 RLQ abdominal pain  06/02/2015  . Sacroiliac joint dysfunction of right side 07/23/2015  . Sacroiliac joint dysfunction of left side 06/19/2016  . BMI 38.0-38.9,adult 04/03/2017  . Dyslipidemia (high LDL; low HDL) 04/05/2017  . Degenerative joint disease (DJD) of hip 10/30/2017  . Post-menopausal 06/24/2018  . Vitamin D deficiency 06/24/2018  . Ventral hernia without obstruction or gangrene 06/28/2019  . 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 No Resolved Ambulatory Problems   No Additional Past Medical History   Reviewed med, allergy, problem list.   Observations/Objective: No acute distress.  .. Today's Vitals   11/14/19 1044  BP: 125/82  Temp: 97.8 F (36.6 C)  TempSrc: Oral  Weight: 274 lb (124.3 kg)  Height: 5\' 6"  (1.676 m)   Body mass index is 44.22 kg/m.   Assessment and Plan: Marland KitchenMarland KitchenLynesha was seen today for multiple sclerosis.  Diagnoses and all orders for this visit:  Sacroiliac joint dysfunction of left side -     traMADol (ULTRAM) 50 MG tablet; Take 1-2 tablets every 6 hours as needed for moderate pain. -     cyclobenzaprine (FLEXERIL) 10 MG tablet; Take 1 tablet (10 mg total) by mouth 3 (three) times daily as needed for muscle spasms. -     predniSONE (DELTASONE) 20 MG tablet; Take 3 tablets for 3 days, take 2 tablets for 3 days, take 1 tablet for 3 days, take 1/2 tablet for 4 days.  Primary osteoarthritis of left hip -     traMADol (ULTRAM) 50 MG  tablet; Take 1-2 tablets every 6 hours as needed for moderate pain. -     cyclobenzaprine (FLEXERIL) 10 MG tablet; Take 1 tablet (10 mg total) by mouth 3 (three) times daily as needed for muscle spasms. -     predniSONE (DELTASONE) 20 MG tablet; Take 3 tablets for 3 days, take 2 tablets for 3 days, take 1 tablet for 3 days, take 1/2 tablet for 4 days.  MS (multiple sclerosis) ([facility]) -     predniSONE (DELTASONE) 20 MG tablet; Take 3 tablets for 3  days, take 2 tablets for 3 days, take 1 tablet for 3 days, take 1/2 tablet for 4 days.  Sacroiliac joint dysfunction of right side -     traMADol (ULTRAM) 50 MG tablet; Take 1-2 tablets every 6 hours as needed for moderate pain. -     cyclobenzaprine (FLEXERIL) 10 MG tablet; Take 1 tablet (10 mg total) by mouth 3 (three) times daily as needed for muscle spasms. -     predniSONE (DELTASONE) 20 MG tablet; Take 3 tablets for 3 days, take 2 tablets for 3 days, take 1 tablet for 3 days, take 1/2 tablet for 4 days.   Explained to patient that this is likely more of a musculoskeletal problem not necessarily her MS worsening.  However if she is not on medication to stop the progression of the mass this could certainly be affecting her in some way.  Okay with prednisone taper.  Explained not to take the Covid vaccine within 14 days before or after. Use muscle relaxer as needed. Discussed some low back exercises that she can do.  Discussed conservative symptomatic therapy with heating packs, icy hot patches and exercises.  If not improving my suggestion is to come to the office to see Dr. Dianah Field to see if an injection in one of her SI joints or hips could be prudent. Small quantity of tramadol given to use for breakthrough as needed pain. Do not take at same time as muscle relaxer.  Marland KitchenMarland KitchenPDMP not reviewed this encounter.  No concerns.     Follow Up Instructions:    I discussed the assessment and treatment plan with the patient. The patient was provided an opportunity to ask questions and all were answered. The patient agreed with the plan and demonstrated an understanding of the instructions.   The patient was advised to call back or seek an in-person evaluation if the symptoms worsen or if the condition fails to improve as anticipated.  I provided 15 minutes of non-face-to-face time during this encounter.   Iran Planas, PA-C

## 2019-11-17 ENCOUNTER — Encounter: Payer: Self-pay | Admitting: Physician Assistant

## 2019-12-04 DIAGNOSIS — H34831 Tributary (branch) retinal vein occlusion, right eye, with macular edema: Secondary | ICD-10-CM | POA: Diagnosis not present

## 2019-12-04 DIAGNOSIS — H43823 Vitreomacular adhesion, bilateral: Secondary | ICD-10-CM | POA: Diagnosis not present

## 2020-01-08 DIAGNOSIS — H43823 Vitreomacular adhesion, bilateral: Secondary | ICD-10-CM | POA: Diagnosis not present

## 2020-01-08 DIAGNOSIS — H34831 Tributary (branch) retinal vein occlusion, right eye, with macular edema: Secondary | ICD-10-CM | POA: Diagnosis not present

## 2020-02-03 ENCOUNTER — Other Ambulatory Visit: Payer: Self-pay | Admitting: Physician Assistant

## 2020-02-03 DIAGNOSIS — Z1231 Encounter for screening mammogram for malignant neoplasm of breast: Secondary | ICD-10-CM

## 2020-02-04 ENCOUNTER — Ambulatory Visit (INDEPENDENT_AMBULATORY_CARE_PROVIDER_SITE_OTHER): Payer: BC Managed Care – PPO

## 2020-02-04 ENCOUNTER — Other Ambulatory Visit: Payer: Self-pay

## 2020-02-04 DIAGNOSIS — Z1231 Encounter for screening mammogram for malignant neoplasm of breast: Secondary | ICD-10-CM | POA: Diagnosis not present

## 2020-02-05 DIAGNOSIS — H34831 Tributary (branch) retinal vein occlusion, right eye, with macular edema: Secondary | ICD-10-CM | POA: Diagnosis not present

## 2020-02-06 NOTE — Progress Notes (Signed)
Rosmary,   Normal mammogram. Follow up in 1 year.

## 2020-02-23 DIAGNOSIS — L720 Epidermal cyst: Secondary | ICD-10-CM | POA: Diagnosis not present

## 2020-02-23 DIAGNOSIS — L918 Other hypertrophic disorders of the skin: Secondary | ICD-10-CM | POA: Diagnosis not present

## 2020-03-04 DIAGNOSIS — H34831 Tributary (branch) retinal vein occlusion, right eye, with macular edema: Secondary | ICD-10-CM | POA: Diagnosis not present

## 2020-03-08 DIAGNOSIS — L720 Epidermal cyst: Secondary | ICD-10-CM | POA: Diagnosis not present

## 2020-04-01 DIAGNOSIS — H43823 Vitreomacular adhesion, bilateral: Secondary | ICD-10-CM | POA: Diagnosis not present

## 2020-04-01 DIAGNOSIS — H34831 Tributary (branch) retinal vein occlusion, right eye, with macular edema: Secondary | ICD-10-CM | POA: Diagnosis not present

## 2020-04-01 DIAGNOSIS — H35033 Hypertensive retinopathy, bilateral: Secondary | ICD-10-CM | POA: Diagnosis not present

## 2020-04-27 ENCOUNTER — Encounter: Payer: Self-pay | Admitting: Physician Assistant

## 2020-04-27 DIAGNOSIS — G35 Multiple sclerosis: Secondary | ICD-10-CM

## 2020-04-28 NOTE — Telephone Encounter (Signed)
MRI referrals pended.

## 2020-05-13 ENCOUNTER — Encounter: Payer: Self-pay | Admitting: Physician Assistant

## 2020-05-14 ENCOUNTER — Ambulatory Visit (INDEPENDENT_AMBULATORY_CARE_PROVIDER_SITE_OTHER): Payer: BC Managed Care – PPO | Admitting: Sports Medicine

## 2020-05-14 ENCOUNTER — Other Ambulatory Visit: Payer: Self-pay

## 2020-05-14 ENCOUNTER — Ambulatory Visit: Payer: Self-pay

## 2020-05-14 DIAGNOSIS — M533 Sacrococcygeal disorders, not elsewhere classified: Secondary | ICD-10-CM

## 2020-05-14 DIAGNOSIS — M1612 Unilateral primary osteoarthritis, left hip: Secondary | ICD-10-CM

## 2020-05-14 MED ORDER — TRAMADOL-ACETAMINOPHEN 37.5-325 MG PO TABS: 1.0000 | ORAL_TABLET | Freq: Three times a day (TID) | ORAL | 0 refills | Status: DC | PRN

## 2020-05-14 NOTE — Progress Notes (Signed)
    Procedures performed today:    Procedure: Real-time Ultrasound Guided injection of the left sacroiliac joint Device: Samsung HS60  Verbal informed consent obtained.  Time-out conducted.  Noted no overlying erythema, induration, or other signs of local infection.  Skin prepped in a sterile fashion.  Local anesthesia: Topical Ethyl chloride.  With sterile technique and under real time ultrasound guidance:  Noted arthritic sacroiliac joint, 22-gauge spinal needle advanced into the joint and I then injected 1 cc Kenalog 40, 2 cc lidocaine, 2 cc bupivacaine easily Completed without difficulty  Pain immediately resolved suggesting accurate placement of the medication.  Advised to call if fevers/chills, erythema, induration, drainage, or persistent bleeding.  Images permanently stored and available for review in PACS.  Impression: Technically successful ultrasound guided injection.  Procedure: Real-time Ultrasound Guided injection of the right sacroiliac joint Device: Samsung HS60  Verbal informed consent obtained.  Time-out conducted.  Noted no overlying erythema, induration, or other signs of local infection.  Skin prepped in a sterile fashion.  Local anesthesia: Topical Ethyl chloride.  With sterile technique and under real time ultrasound guidance:  Noted arthritic sacroiliac joint, 22-gauge spinal needle advanced into the joint and I then injected 1 cc Kenalog 40, 2 cc lidocaine, 2 cc bupivacaine easily Completed without difficulty  Pain immediately resolved suggesting accurate placement of the medication.  Advised to call if fevers/chills, erythema, induration, drainage, or persistent bleeding.  Images permanently stored and available for review in PACS.  Impression: Technically successful ultrasound guided injection.  Independent interpretation of notes and tests performed by another provider:   None.  Brief History, Exam, Impression, and Recommendations:    Sacroiliac joint  dysfunction of both sides This is a pleasant 59 year old female, she has multifactorial low back pain, she has some spinal stenosis, as well as lower lumbar facet DDD, she also has facet osteoarthritis on my personal review of the CT scan from about 5 years ago. She last had SI joint injections with Dr. Georgina Snell back in 2019, increasing pain in her sacroiliac joints today, worse with standing. These were injected today, I would like to see her back in about a month and if insufficient improved we will consider lumbar spine advanced imaging and facet joint injections. Switching from tramadol to Ultracet for now for use up to 3 times daily.  Primary osteoarthritis of left hip Tells me she had an injection in her left hip joint that seem to hurt more than it help, she did have some initial improvement. Per Dr. 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 I did explain to her that the next step was probably going to be a hip arthroplasty rather than repeat injections, she is not yet ready for hip arthroplasty so we may consider a single additional injection in the future.    ___________________________________________ Gwen Her. Dianah Field, M.D., ABFM., CAQSM. Primary Care and [place] Instructor of [place] [facility]

## 2020-05-14 NOTE — Assessment & Plan Note (Signed)
This is a pleasant 59 year old female, she has multifactorial low back pain, she has some spinal stenosis, as well as lower lumbar facet DDD, she also has facet osteoarthritis on my personal review of the CT scan from about 5 years ago. She last had SI joint injections with Dr. Georgina Snell back in 2019, increasing pain in her sacroiliac joints today, worse with standing. These were injected today, I would like to see her back in about a month and if insufficient improved we will consider lumbar spine advanced imaging and facet joint injections. Switching from tramadol to Ultracet for now for use up to 3 times daily.

## 2020-05-14 NOTE — Assessment & Plan Note (Signed)
Tells me she had an injection in her left hip joint that seem to hurt more than it help, she did have some initial improvement. Per Dr. 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 I did explain to her that the next step was probably going to be a hip arthroplasty rather than repeat injections, she is not yet ready for hip arthroplasty so we may consider a single additional injection in the future.

## 2020-05-20 DIAGNOSIS — H35033 Hypertensive retinopathy, bilateral: Secondary | ICD-10-CM | POA: Diagnosis not present

## 2020-05-20 DIAGNOSIS — H34831 Tributary (branch) retinal vein occlusion, right eye, with macular edema: Secondary | ICD-10-CM | POA: Diagnosis not present

## 2020-05-20 DIAGNOSIS — H43811 Vitreous degeneration, right eye: Secondary | ICD-10-CM | POA: Diagnosis not present

## 2020-05-20 DIAGNOSIS — H43822 Vitreomacular adhesion, left eye: Secondary | ICD-10-CM | POA: Diagnosis not present

## 2020-05-24 ENCOUNTER — Encounter: Payer: Self-pay | Admitting: Physician Assistant

## 2020-05-24 ENCOUNTER — Ambulatory Visit (INDEPENDENT_AMBULATORY_CARE_PROVIDER_SITE_OTHER): Payer: BC Managed Care – PPO

## 2020-05-24 ENCOUNTER — Other Ambulatory Visit: Payer: Self-pay

## 2020-05-24 DIAGNOSIS — M50221 Other cervical disc displacement at C4-C5 level: Secondary | ICD-10-CM | POA: Diagnosis not present

## 2020-05-24 DIAGNOSIS — G35 Multiple sclerosis: Secondary | ICD-10-CM

## 2020-05-24 DIAGNOSIS — R9082 White matter disease, unspecified: Secondary | ICD-10-CM | POA: Diagnosis not present

## 2020-05-24 DIAGNOSIS — M4802 Spinal stenosis, cervical region: Secondary | ICD-10-CM | POA: Diagnosis not present

## 2020-05-24 DIAGNOSIS — G959 Disease of spinal cord, unspecified: Secondary | ICD-10-CM | POA: Diagnosis not present

## 2020-05-24 DIAGNOSIS — G379 Demyelinating disease of central nervous system, unspecified: Secondary | ICD-10-CM | POA: Diagnosis not present

## 2020-05-24 DIAGNOSIS — M5124 Other intervertebral disc displacement, thoracic region: Secondary | ICD-10-CM | POA: Diagnosis not present

## 2020-05-24 DIAGNOSIS — M5134 Other intervertebral disc degeneration, thoracic region: Secondary | ICD-10-CM | POA: Diagnosis not present

## 2020-05-24 MED ORDER — GADOBUTROL 1 MMOL/ML IV SOLN
10.0000 mL | Freq: Once | INTRAVENOUS | Status: AC | PRN
  Administered 2020-05-24: 10 mL via INTRAVENOUS

## 2020-05-24 NOTE — Telephone Encounter (Signed)
Thoracic spine dose have some disc protrusions this could certainly be causing pain. Are you having mid back pain and radiation of pain more to the right than left. I would say start with our sports medicine provider. At times injections can be beneficial.

## 2020-05-24 NOTE — Telephone Encounter (Signed)
Stable cervical spine MRI from 2017.

## 2020-05-24 NOTE — Telephone Encounter (Signed)
Stable brain MRI from 2017.

## 2020-06-08 DIAGNOSIS — M47816 Spondylosis without myelopathy or radiculopathy, lumbar region: Secondary | ICD-10-CM | POA: Insufficient documentation

## 2020-06-08 NOTE — Assessment & Plan Note (Signed)
Disease and facet arthritis, good improvement with SI joint injections but still with some discomfort, due to failure of greater than 6 weeks of conservative measures we are going to proceed with a lumbar spine MRI, we can discuss the results at her SI joint injection follow-up.

## 2020-06-14 ENCOUNTER — Other Ambulatory Visit: Payer: Self-pay

## 2020-06-14 ENCOUNTER — Ambulatory Visit (INDEPENDENT_AMBULATORY_CARE_PROVIDER_SITE_OTHER): Payer: BC Managed Care – PPO

## 2020-06-14 DIAGNOSIS — M5126 Other intervertebral disc displacement, lumbar region: Secondary | ICD-10-CM

## 2020-06-14 DIAGNOSIS — M48061 Spinal stenosis, lumbar region without neurogenic claudication: Secondary | ICD-10-CM

## 2020-06-14 DIAGNOSIS — M47816 Spondylosis without myelopathy or radiculopathy, lumbar region: Secondary | ICD-10-CM | POA: Diagnosis not present

## 2020-06-14 DIAGNOSIS — G8929 Other chronic pain: Secondary | ICD-10-CM | POA: Diagnosis not present

## 2020-06-30 ENCOUNTER — Encounter: Payer: Self-pay | Admitting: Physician Assistant

## 2020-06-30 ENCOUNTER — Ambulatory Visit (INDEPENDENT_AMBULATORY_CARE_PROVIDER_SITE_OTHER): Payer: BC Managed Care – PPO | Admitting: Physician Assistant

## 2020-06-30 ENCOUNTER — Other Ambulatory Visit: Payer: Self-pay

## 2020-06-30 VITALS — BP 179/94 | HR 105 | Ht 66.0 in | Wt 282.0 lb

## 2020-06-30 DIAGNOSIS — E785 Hyperlipidemia, unspecified: Secondary | ICD-10-CM

## 2020-06-30 DIAGNOSIS — Z13220 Encounter for screening for lipoid disorders: Secondary | ICD-10-CM | POA: Diagnosis not present

## 2020-06-30 DIAGNOSIS — G35 Multiple sclerosis: Secondary | ICD-10-CM

## 2020-06-30 DIAGNOSIS — Z0000 Encounter for general adult medical examination without abnormal findings: Secondary | ICD-10-CM

## 2020-06-30 DIAGNOSIS — E559 Vitamin D deficiency, unspecified: Secondary | ICD-10-CM

## 2020-06-30 DIAGNOSIS — Z6841 Body Mass Index (BMI) 40.0 and over, adult: Secondary | ICD-10-CM

## 2020-06-30 DIAGNOSIS — Z1329 Encounter for screening for other suspected endocrine disorder: Secondary | ICD-10-CM

## 2020-06-30 DIAGNOSIS — K635 Polyp of colon: Secondary | ICD-10-CM

## 2020-06-30 DIAGNOSIS — R030 Elevated blood-pressure reading, without diagnosis of hypertension: Secondary | ICD-10-CM

## 2020-06-30 DIAGNOSIS — E66813 Obesity, class 3: Secondary | ICD-10-CM

## 2020-06-30 MED ORDER — WEGOVY 0.25 MG/0.5ML ~~LOC~~ SOAJ: 0.2500 mg | SUBCUTANEOUS | 0 refills | Status: DC

## 2020-06-30 NOTE — Patient Instructions (Signed)
Health Maintenance After Age 60 After age 60, you are at a higher risk for certain long-term diseases and infections as well as injuries from falls. Falls are a major cause of broken bones and head injuries in people who are older than age 60. Getting regular preventive care can help to keep you healthy and well. Preventive care includes getting regular testing and making lifestyle changes as recommended by your health care provider. Talk with your health care provider about:  Which screenings and tests you should have. A screening is a test that checks for a disease when you have no symptoms.  A diet and exercise plan that is right for you. What should I know about screenings and tests to prevent falls? Screening and testing are the best ways to find a health problem early. Early diagnosis and treatment give you the best chance of managing medical conditions that are common after age 60. Certain conditions and lifestyle choices may make you more likely to have a fall. Your health care provider may recommend:  Regular vision checks. Poor vision and conditions such as cataracts can make you more likely to have a fall. If you wear glasses, make sure to get your prescription updated if your vision changes.  Medicine review. Work with your health care provider to regularly review all of the medicines you are taking, including over-the-counter medicines. Ask your health care provider about any side effects that may make you more likely to have a fall. Tell your health care provider if any medicines that you take make you feel dizzy or sleepy.  Osteoporosis screening. Osteoporosis is a condition that causes the bones to get weaker. This can make the bones weak and cause them to break more easily.  Blood pressure screening. Blood pressure changes and medicines to control blood pressure can make you feel dizzy.  Strength and balance checks. Your health care provider may recommend certain tests to check your  strength and balance while standing, walking, or changing positions.  Foot health exam. Foot pain and numbness, as well as not wearing proper footwear, can make you more likely to have a fall.  Depression screening. You may be more likely to have a fall if you have a fear of falling, feel emotionally low, or feel unable to do activities that you used to do.  Alcohol use screening. Using too much alcohol can affect your balance and may make you more likely to have a fall. What actions can I take to lower my risk of falls? General instructions  Talk with your health care provider about your risks for falling. Tell your health care provider if: ? You fall. Be sure to tell your health care provider about all falls, even ones that seem minor. ? You feel dizzy, sleepy, or off-balance.  Take over-the-counter and prescription medicines only as told by your health care provider. These include any supplements.  Eat a healthy diet and maintain a healthy weight. A healthy diet includes low-fat dairy products, low-fat (lean) meats, and fiber from whole grains, beans, and lots of fruits and vegetables. Home safety  Remove any tripping hazards, such as rugs, cords, and clutter.  Install safety equipment such as grab bars in bathrooms and safety rails on stairs.  Keep rooms and walkways well-lit. Activity   Follow a regular exercise program to stay fit. This will help you maintain your balance. Ask your health care provider what types of exercise are appropriate for you.  If you need a cane or   walker, use it as recommended by your health care provider.  Wear supportive shoes that have nonskid soles. Lifestyle  Do not drink alcohol if your health care provider tells you not to drink.  If you drink alcohol, limit how much you have: ? 0-1 drink a day for women. ? 0-2 drinks a day for men.  Be aware of how much alcohol is in your drink. In the [place] one drink equals one typical bottle of beer (12  oz), one-half glass of wine (5 oz), or one shot of hard liquor (1 oz).  Do not use any products that contain nicotine or tobacco, such as cigarettes and e-cigarettes. If you need help quitting, ask your health care provider. Summary  Having a healthy lifestyle and getting preventive care can help to protect your health and wellness after age 60.  Screening and testing are the best way to find a health problem early and help you avoid having a fall. Early diagnosis and treatment give you the best chance for managing medical conditions that are more common for people who are older than age 60.  Falls are a major cause of broken bones and head injuries in people who are older than age 60. Take precautions to prevent a fall at home.  Work with your health care provider to learn what changes you can make to improve your health and wellness and to prevent falls. This information is not intended to replace advice given to you by your health care provider. Make sure you discuss any questions you have with your health care provider. Document Revised: 11/07/2018 Document Reviewed: 05/30/2017 Elsevier Patient Education  2020 Elsevier Inc.  

## 2020-06-30 NOTE — Progress Notes (Signed)
Subjective:    Patient ID: Sandra Hartman, female    DOB: 01-14-1961, 59 y.o.   MRN: [id number]      Subjective:     Sandra Hartman is a 59 y.o. female and is here for a comprehensive physical exam. The patient reports no problems.  Pt has white coat HTN. Checking BP at home and 125/80.   Social History   Socioeconomic History  . Marital status: Single    Spouse name: Not on file  . Number of children: Not on file  . Years of education: Not on file  . Highest education level: Not on file  Occupational History  . Not on file  Tobacco Use  . Smoking status: Never Smoker  . Smokeless tobacco: Never Used  Substance and Sexual Activity  . Alcohol use: No  . Drug use: No  . Sexual activity: Not Currently  Other Topics Concern  . Not on file  Social History Narrative  . Not on file   Social Determinants of Health   Financial Resource Strain:   . Difficulty of Paying Living Expenses: Not on file  Food Insecurity:   . Worried About Charity fundraiser in the Last Year: Not on file  . Ran Out of Food in the Last Year: Not on file  Transportation Needs:   . Lack of Transportation (Medical): Not on file  . Lack of Transportation (Non-Medical): Not on file  Physical Activity:   . Days of Exercise per Week: Not on file  . Minutes of Exercise per Session: Not on file  Stress:   . Feeling of Stress : Not on file  Social Connections:   . Frequency of Communication with Friends and Family: Not on file  . Frequency of Social Gatherings with Friends and Family: Not on file  . Attends Religious Services: Not on file  . Active Member of Clubs or Organizations: Not on file  . Attends Archivist Meetings: Not on file  . Marital Status: Not on file  Intimate Partner Violence:   . Fear of Current or Ex-Partner: Not on file  . Emotionally Abused: Not on file  . Physically Abused: Not on file  . Sexually Abused: Not on file   Health Maintenance  Topic Date Due  .  INFLUENZA VACCINE  10/28/2020 (Originally 02/29/2020)  . HIV Screening  06/30/2021 (Originally 01/09/1975)  . TETANUS/TDAP  04/04/2026 (Originally 01/09/1979)  . MAMMOGRAM  02/03/2022  . COLONOSCOPY  12/07/2024  . COVID-19 Vaccine  Completed  . Hepatitis C Screening  Completed    The following portions of the patient's history were reviewed and updated as appropriate: allergies, current medications, past family history, past medical history, past social history, past surgical history and problem list.  Review of Systems Pertinent items noted in HPI and remainder of comprehensive ROS otherwise negative.   Objective:    BP (!) 179/94   Pulse (!) 105   Ht 5\' 6"  (1.676 m)   Wt 282 lb (127.9 kg)   LMP 10/25/2012   SpO2 98%   BMI 45.52 kg/m  General appearance: alert, cooperative, appears stated age and morbidly obese Head: Normocephalic, without obvious abnormality, atraumatic Eyes: conjunctivae/corneas clear. PERRL, EOM's intact. Fundi benign. Ears: normal TM's and external ear canals both ears Nose: Nares normal. Septum midline. Mucosa normal. No drainage or sinus tenderness. Throat: lips, mucosa, and tongue normal; teeth and gums normal Neck: no adenopathy, no carotid bruit, no JVD, supple, symmetrical, trachea midline and thyroid not  enlarged, symmetric, no tenderness/mass/nodules Back: symmetric, no curvature. ROM normal. No CVA tenderness. Lungs: clear to auscultation bilaterally Heart: regular rate and rhythm, S1, S2 normal, no murmur, click, rub or gallop Abdomen: soft, non-tender; bowel sounds normal; no masses,  no organomegaly Extremities: extremities normal, atraumatic, no cyanosis or edema Pulses: 2+ and symmetric Skin: Skin color, texture, turgor normal. No rashes or lesions Lymph nodes: Cervical, supraclavicular, and axillary nodes normal. Neurologic: Alert and oriented X 3, normal strength and tone. Normal symmetric reflexes. Normal coordination and gait    .Marland Kitchen Depression screen [facility] 2/9 06/30/2020 06/25/2019 06/24/2018 10/23/2017 04/03/2017  Decreased Interest 0 0 0 0 0  Down, Depressed, Hopeless 0 0 0 0 0  PHQ - 2 Score 0 0 0 0 0  Altered sleeping 0 0 0 0 -  Tired, decreased energy 0 0 0 0 -  Change in appetite 0 0 0 0 -  Feeling bad or failure about yourself  0 0 0 0 -  Trouble concentrating 0 0 0 0 -  Moving slowly or fidgety/restless 0 0 0 0 -  Suicidal thoughts 0 0 0 0 -  PHQ-9 Score 0 0 0 0 -  Difficult doing work/chores Not difficult at all Not difficult at all Not difficult at all - -   .Marland Kitchen GAD 7 : Generalized Anxiety Score 06/30/2020 06/25/2019 06/24/2018  Nervous, Anxious, on Edge 0 0 0  Control/stop worrying 0 0 0  Worry too much - different things 0 0 0  Trouble relaxing 0 0 0  Restless 0 0 0  Easily annoyed or irritable 0 0 0  Afraid - awful might happen 0 0 0  Total GAD 7 Score 0 0 0  Anxiety Difficulty Not difficult at all Not difficult at all Not difficult at all     Assessment:    Healthy female exam.      Plan:    Marland KitchenMarland KitchenAmmy was seen today for annual exam.  Diagnoses and all orders for this visit:  Routine physical examination -     CBC with Differential/Platelet -     COMPLETE METABOLIC PANEL WITH GFR -     Lipid Panel w/reflex Direct LDL -     TSH -     HgB A1c  MS (multiple sclerosis) (HCC) -     CBC with Differential/Platelet -     COMPLETE METABOLIC PANEL WITH GFR -     Lipid Panel w/reflex Direct LDL -     TSH  Screening for lipid disorders -     Lipid Panel w/reflex Direct LDL  Dyslipidemia (high LDL; low HDL) -     Lipid Panel w/reflex Direct LDL  Vitamin D deficiency -     Vitamin D (25 hydroxy)  Thyroid disorder screen -     TSH  Class 3 severe obesity due to excess calories without serious comorbidity with body mass index (BMI) of 40.0 to 44.9 in adult (HCC) -     HgB A1c -     Semaglutide-Weight Management (WEGOVY) 0.25 MG/0.5ML SOAJ; Inject 0.25 mg into the skin once a  week.  Polyp of colon, unspecified part of colon, unspecified type  White coat syndrome without diagnosis of hypertension   .Marland Kitchen Discussed 150 minutes of exercise a week.  Encouraged vitamin D 1000 units and Calcium 1300mg  or 4 servings of dairy a day.  Fasting labs ordered.  Pap not needed.  Mammogram UTD.  Colonoscopy needed this year for 5 year follow up after colon  polyps in 2016.  covid and flu UTD.  Pt declines shingles vaccine today.   Marland Kitchen.Discussed low carb diet with 1500 calories and 80g of protein.  Exercising at least 150 minutes a week.  My Fitness Pal could be a Microbiologist.  Pt aware for her overall health she needs to lose weight. Discussed options.  Started [employer]. Discussed side effects.  Follow up in office in 3 months.  See After Visit Summary for Counseling Recommendations

## 2020-07-01 DIAGNOSIS — G379 Demyelinating disease of central nervous system, unspecified: Secondary | ICD-10-CM | POA: Diagnosis not present

## 2020-07-01 LAB — COMPLETE METABOLIC PANEL WITH GFR
AG Ratio: 1.5 (calc) (ref 1.0–2.5)
ALT: 20 U/L (ref 6–29)
AST: 17 U/L (ref 10–35)
Albumin: 4.3 g/dL (ref 3.6–5.1)
Alkaline phosphatase (APISO): 73 U/L (ref 37–153)
BUN: 14 mg/dL (ref 7–25)
CO2: 25 mmol/L (ref 20–32)
Calcium: 10 mg/dL (ref 8.6–10.4)
Chloride: 102 mmol/L (ref 98–110)
Creat: 0.83 mg/dL (ref 0.50–0.99)
GFR, Est African American: 89 mL/min/{1.73_m2} (ref >=60)
GFR, Est Non African American: 77 mL/min/{1.73_m2} (ref >=60)
Globulin: 2.9 g/dL (calc) (ref 1.9–3.7)
Glucose, Bld: 92 mg/dL (ref 65–99)
Potassium: 4.5 mmol/L (ref 3.5–5.3)
Sodium: 138 mmol/L (ref 135–146)
Total Bilirubin: 0.6 mg/dL (ref 0.2–1.2)
Total Protein: 7.2 g/dL (ref 6.1–8.1)

## 2020-07-01 LAB — CBC WITH DIFFERENTIAL/PLATELET
Absolute Monocytes: 368 cells/uL (ref 200–950)
Basophils Absolute: 90 cells/uL (ref 0–200)
Basophils Relative: 1.2 %
Eosinophils Absolute: 128 cells/uL (ref 15–500)
Eosinophils Relative: 1.7 %
HCT: 42.1 % (ref 35.0–45.0)
Hemoglobin: 14.6 g/dL (ref 11.7–15.5)
Lymphs Abs: 1590 cells/uL (ref 850–3900)
MCH: 32.7 pg (ref 27.0–33.0)
MCHC: 34.7 g/dL (ref 32.0–36.0)
MCV: 94.4 fL (ref 80.0–100.0)
MPV: 10.6 fL (ref 7.5–12.5)
Monocytes Relative: 4.9 %
Neutro Abs: 5325 cells/uL (ref 1500–7800)
Neutrophils Relative %: 71 %
Platelets: 285 10*3/uL (ref 140–400)
RBC: 4.46 10*6/uL (ref 3.80–5.10)
RDW: 12.4 % (ref 11.0–15.0)
Total Lymphocyte: 21.2 %
WBC: 7.5 10*3/uL (ref 3.8–10.8)

## 2020-07-01 LAB — HEMOGLOBIN A1C
Hgb A1c MFr Bld: 5.4 % of total Hgb (ref <5.7)
Mean Plasma Glucose: 108 (calc)
eAG (mmol/L): 6 (calc)

## 2020-07-01 LAB — TSH: TSH: 1.69 mIU/L (ref 0.40–4.50)

## 2020-07-01 LAB — LIPID PANEL W/REFLEX DIRECT LDL
Cholesterol: 290 mg/dL — ABNORMAL HIGH (ref <200)
HDL: 59 mg/dL (ref >=50)
LDL Cholesterol (Calc): 186 mg/dL (calc) — ABNORMAL HIGH
Non-HDL Cholesterol (Calc): 231 mg/dL (calc) — ABNORMAL HIGH (ref <130)
Total CHOL/HDL Ratio: 4.9 (calc) (ref <5.0)
Triglycerides: 250 mg/dL — ABNORMAL HIGH (ref <150)

## 2020-07-02 NOTE — Progress Notes (Signed)
Naidelin,   Hemoglobin looks great.  Kidney, liver, glucose looks great.  Thyroid is perfect.  A1c normal.  HDL looks great.  LDL better but not to our goal. I would consider low dose cholesterol medication to keep overall risk of CV event low. I know you have not wanted to do this in the past. Perhaps if you lost weight your cholesterol would decrease more. Your calculated risk if we use home readings is still 3.9 percent in next 10 years.

## 2020-07-14 ENCOUNTER — Encounter: Payer: Self-pay | Admitting: Physician Assistant

## 2020-07-14 DIAGNOSIS — H34831 Tributary (branch) retinal vein occlusion, right eye, with macular edema: Secondary | ICD-10-CM | POA: Diagnosis not present

## 2020-07-14 DIAGNOSIS — H35033 Hypertensive retinopathy, bilateral: Secondary | ICD-10-CM | POA: Diagnosis not present

## 2020-07-14 DIAGNOSIS — H43811 Vitreous degeneration, right eye: Secondary | ICD-10-CM | POA: Diagnosis not present

## 2020-07-14 DIAGNOSIS — H43823 Vitreomacular adhesion, bilateral: Secondary | ICD-10-CM | POA: Diagnosis not present

## 2020-07-19 ENCOUNTER — Other Ambulatory Visit: Payer: Self-pay

## 2020-07-19 ENCOUNTER — Ambulatory Visit (INDEPENDENT_AMBULATORY_CARE_PROVIDER_SITE_OTHER): Payer: BC Managed Care – PPO | Admitting: Sports Medicine

## 2020-07-19 DIAGNOSIS — M533 Sacrococcygeal disorders, not elsewhere classified: Secondary | ICD-10-CM | POA: Diagnosis not present

## 2020-07-19 DIAGNOSIS — M47816 Spondylosis without myelopathy or radiculopathy, lumbar region: Secondary | ICD-10-CM

## 2020-07-19 MED ORDER — OXYCODONE-ACETAMINOPHEN 10-325 MG PO TABS: 1.0000 | ORAL_TABLET | Freq: Three times a day (TID) | ORAL | 0 refills | Status: DC | PRN

## 2020-07-19 NOTE — Assessment & Plan Note (Signed)
There is mild disc disease but more predominant facet arthritis from L3-L5 bilaterally. I think her prominent pain generator today are her facet joints. I am going to going to set her up with bilateral L3-L5 facet joint injections with Dr. Francesco Runner. Return to see me 1 month after the injections, adding high-dose Percocet for pain relief in the meantime.

## 2020-07-19 NOTE — Assessment & Plan Note (Signed)
Moderate response to SI joint injections back in October. Now having pain somewhat higher up and closer to the midline consistent with facet joint mediated pain.

## 2020-07-19 NOTE — Progress Notes (Signed)
    Procedures performed today:    None.  Independent interpretation of notes and tests performed by another provider:   MRI personally reviewed, very mild disc disease in the lumbar spine, dominant finding is bilateral L3-L5 facet joint arthritis.  Thoracic spine MRI also reviewed, dominant finding is a right sided T7-T8 disc protrusion with only mild foraminal stenosis.  Brief History, Exam, Impression, and Recommendations:    Sacroiliac joint dysfunction of both sides Moderate response to SI joint injections back in October. Now having pain somewhat higher up and closer to the midline consistent with facet joint mediated pain.  Lumbar spondylosis There is mild disc disease but more predominant facet arthritis from L3-L5 bilaterally. I think her prominent pain generator today are her facet joints. I am going to going to set her up with bilateral L3-L5 facet joint injections with Dr. Francesco Runner. Return to see me 1 month after the injections, adding high-dose Percocet for pain relief in the meantime.    ___________________________________________ Gwen Her. Dianah Field, M.D., ABFM., CAQSM. Primary Care and [place] Instructor of [place] [employer] [facility] of Medicine

## 2020-08-10 ENCOUNTER — Telehealth: Payer: Self-pay | Admitting: Neurology

## 2020-08-10 NOTE — Telephone Encounter (Signed)
Prior Authorization for [facility] submitted via covermymeds.  Your request has been approved [id number];BRAXEN:[id number];Review Type:Prior Auth;Coverage Start Date:07/11/2020;Coverage End Date:03/08/2021;

## 2020-08-24 ENCOUNTER — Other Ambulatory Visit: Payer: Self-pay | Admitting: Physician Assistant

## 2020-08-26 MED ORDER — WEGOVY 0.5 MG/0.5ML ~~LOC~~ SOAJ: 0.5000 mg | SUBCUTANEOUS | 0 refills | Status: DC

## 2020-08-27 ENCOUNTER — Encounter: Payer: Self-pay | Admitting: Physician Assistant

## 2020-08-30 ENCOUNTER — Encounter: Payer: Self-pay | Admitting: Physician Assistant

## 2020-08-30 MED ORDER — WEGOVY 1 MG/0.5ML ~~LOC~~ SOAJ: 1.0000 mg | SUBCUTANEOUS | 0 refills | Status: DC

## 2020-08-30 MED ORDER — WEGOVY 2.4 MG/0.75ML ~~LOC~~ SOAJ: 2.4000 mg | SUBCUTANEOUS | 0 refills | Status: DC

## 2020-08-30 MED ORDER — WEGOVY 1.7 MG/0.75ML ~~LOC~~ SOAJ: 1.7000 mg | SUBCUTANEOUS | 0 refills | Status: DC

## 2020-12-13 ENCOUNTER — Other Ambulatory Visit: Payer: Self-pay | Admitting: Physician Assistant

## 2020-12-20 ENCOUNTER — Other Ambulatory Visit: Payer: Self-pay | Admitting: Physician Assistant

## 2020-12-28 ENCOUNTER — Other Ambulatory Visit: Payer: Self-pay | Admitting: Neurology

## 2020-12-28 NOTE — Telephone Encounter (Signed)
Patient called and left vm asking for refill of Wegovy 2.4 mg weekly. She was supposed to have shot on Sunday. Last appt in December. Please advise.

## 2020-12-29 MED ORDER — WEGOVY 2.4 MG/0.75ML ~~LOC~~ SOAJ: 2.4000 mg | SUBCUTANEOUS | 0 refills | Status: DC

## 2020-12-29 NOTE — Telephone Encounter (Signed)
Needs appt. Will send refill.

## 2020-12-29 NOTE — Telephone Encounter (Signed)
Called patient to schedule appointment and patient was on vacation and said she would call back to get this appt scheduled when she got home & got her calendar. AM

## 2020-12-29 NOTE — Telephone Encounter (Signed)
Please have patient schedule appt.

## 2021-01-13 ENCOUNTER — Other Ambulatory Visit: Payer: Self-pay | Admitting: Physician Assistant

## 2021-01-13 ENCOUNTER — Other Ambulatory Visit (HOSPITAL_BASED_OUTPATIENT_CLINIC_OR_DEPARTMENT_OTHER): Payer: Self-pay | Admitting: Physician Assistant

## 2021-01-13 DIAGNOSIS — Z1231 Encounter for screening mammogram for malignant neoplasm of breast: Secondary | ICD-10-CM

## 2021-01-14 MED ORDER — WEGOVY 2.4 MG/0.75ML ~~LOC~~ SOAJ: 2.4000 mg | SUBCUTANEOUS | 1 refills | Status: DC

## 2021-02-09 ENCOUNTER — Ambulatory Visit (INDEPENDENT_AMBULATORY_CARE_PROVIDER_SITE_OTHER): Payer: BC Managed Care – PPO

## 2021-02-09 ENCOUNTER — Other Ambulatory Visit: Payer: Self-pay

## 2021-02-09 DIAGNOSIS — Z1231 Encounter for screening mammogram for malignant neoplasm of breast: Secondary | ICD-10-CM | POA: Diagnosis not present

## 2021-02-11 NOTE — Progress Notes (Signed)
Normal mammogram. Follow up in 1 year.

## 2021-04-12 DIAGNOSIS — K432 Incisional hernia without obstruction or gangrene: Secondary | ICD-10-CM | POA: Diagnosis not present

## 2021-05-10 DIAGNOSIS — G35 Multiple sclerosis: Secondary | ICD-10-CM | POA: Diagnosis not present

## 2021-05-10 DIAGNOSIS — K432 Incisional hernia without obstruction or gangrene: Secondary | ICD-10-CM | POA: Diagnosis not present

## 2021-05-10 DIAGNOSIS — K660 Peritoneal adhesions (postprocedural) (postinfection): Secondary | ICD-10-CM | POA: Diagnosis not present

## 2021-05-10 DIAGNOSIS — Z888 Allergy status to other drugs, medicaments and biological substances status: Secondary | ICD-10-CM | POA: Diagnosis not present

## 2021-05-10 DIAGNOSIS — K439 Ventral hernia without obstruction or gangrene: Secondary | ICD-10-CM | POA: Diagnosis not present

## 2021-05-10 DIAGNOSIS — Z6841 Body Mass Index (BMI) 40.0 and over, adult: Secondary | ICD-10-CM | POA: Diagnosis not present

## 2021-05-10 DIAGNOSIS — Z90710 Acquired absence of both cervix and uterus: Secondary | ICD-10-CM | POA: Diagnosis not present

## 2021-05-11 DIAGNOSIS — Z888 Allergy status to other drugs, medicaments and biological substances status: Secondary | ICD-10-CM | POA: Diagnosis not present

## 2021-05-11 DIAGNOSIS — K432 Incisional hernia without obstruction or gangrene: Secondary | ICD-10-CM | POA: Diagnosis not present

## 2021-05-11 DIAGNOSIS — K660 Peritoneal adhesions (postprocedural) (postinfection): Secondary | ICD-10-CM | POA: Diagnosis not present

## 2021-05-11 DIAGNOSIS — Z6841 Body Mass Index (BMI) 40.0 and over, adult: Secondary | ICD-10-CM | POA: Diagnosis not present

## 2021-05-11 DIAGNOSIS — G35 Multiple sclerosis: Secondary | ICD-10-CM | POA: Diagnosis not present

## 2021-05-11 DIAGNOSIS — Z90710 Acquired absence of both cervix and uterus: Secondary | ICD-10-CM | POA: Diagnosis not present

## 2021-05-12 DIAGNOSIS — Z90710 Acquired absence of both cervix and uterus: Secondary | ICD-10-CM | POA: Diagnosis not present

## 2021-05-12 DIAGNOSIS — K432 Incisional hernia without obstruction or gangrene: Secondary | ICD-10-CM | POA: Diagnosis not present

## 2021-05-12 DIAGNOSIS — G35 Multiple sclerosis: Secondary | ICD-10-CM | POA: Diagnosis not present

## 2021-05-12 DIAGNOSIS — K660 Peritoneal adhesions (postprocedural) (postinfection): Secondary | ICD-10-CM | POA: Diagnosis not present

## 2021-05-12 DIAGNOSIS — Z6841 Body Mass Index (BMI) 40.0 and over, adult: Secondary | ICD-10-CM | POA: Diagnosis not present

## 2021-05-12 DIAGNOSIS — Z888 Allergy status to other drugs, medicaments and biological substances status: Secondary | ICD-10-CM | POA: Diagnosis not present

## 2021-05-13 DIAGNOSIS — K432 Incisional hernia without obstruction or gangrene: Secondary | ICD-10-CM | POA: Diagnosis not present

## 2021-05-13 DIAGNOSIS — Z90710 Acquired absence of both cervix and uterus: Secondary | ICD-10-CM | POA: Diagnosis not present

## 2021-05-13 DIAGNOSIS — Z888 Allergy status to other drugs, medicaments and biological substances status: Secondary | ICD-10-CM | POA: Diagnosis not present

## 2021-05-13 DIAGNOSIS — K660 Peritoneal adhesions (postprocedural) (postinfection): Secondary | ICD-10-CM | POA: Diagnosis not present

## 2021-05-13 DIAGNOSIS — G35 Multiple sclerosis: Secondary | ICD-10-CM | POA: Diagnosis not present

## 2021-05-13 DIAGNOSIS — Z6841 Body Mass Index (BMI) 40.0 and over, adult: Secondary | ICD-10-CM | POA: Diagnosis not present

## 2021-05-14 DIAGNOSIS — G35 Multiple sclerosis: Secondary | ICD-10-CM | POA: Diagnosis not present

## 2021-05-14 DIAGNOSIS — Z888 Allergy status to other drugs, medicaments and biological substances status: Secondary | ICD-10-CM | POA: Diagnosis not present

## 2021-05-14 DIAGNOSIS — K660 Peritoneal adhesions (postprocedural) (postinfection): Secondary | ICD-10-CM | POA: Diagnosis not present

## 2021-05-14 DIAGNOSIS — K432 Incisional hernia without obstruction or gangrene: Secondary | ICD-10-CM | POA: Diagnosis not present

## 2021-05-14 DIAGNOSIS — Z6841 Body Mass Index (BMI) 40.0 and over, adult: Secondary | ICD-10-CM | POA: Diagnosis not present

## 2021-05-14 DIAGNOSIS — Z90710 Acquired absence of both cervix and uterus: Secondary | ICD-10-CM | POA: Diagnosis not present

## 2021-07-06 ENCOUNTER — Ambulatory Visit (INDEPENDENT_AMBULATORY_CARE_PROVIDER_SITE_OTHER): Payer: BC Managed Care – PPO | Admitting: Physician Assistant

## 2021-07-06 ENCOUNTER — Encounter: Payer: Self-pay | Admitting: Physician Assistant

## 2021-07-06 ENCOUNTER — Encounter: Payer: BC Managed Care – PPO | Admitting: Physician Assistant

## 2021-07-06 ENCOUNTER — Other Ambulatory Visit: Payer: Self-pay

## 2021-07-06 VITALS — BP 164/89 | HR 90 | Ht 66.0 in | Wt 250.0 lb

## 2021-07-06 DIAGNOSIS — R7301 Impaired fasting glucose: Secondary | ICD-10-CM

## 2021-07-06 DIAGNOSIS — R030 Elevated blood-pressure reading, without diagnosis of hypertension: Secondary | ICD-10-CM | POA: Diagnosis not present

## 2021-07-06 DIAGNOSIS — Z6841 Body Mass Index (BMI) 40.0 and over, adult: Secondary | ICD-10-CM

## 2021-07-06 DIAGNOSIS — Z780 Asymptomatic menopausal state: Secondary | ICD-10-CM

## 2021-07-06 DIAGNOSIS — G35 Multiple sclerosis: Secondary | ICD-10-CM | POA: Diagnosis not present

## 2021-07-06 DIAGNOSIS — Z0000 Encounter for general adult medical examination without abnormal findings: Secondary | ICD-10-CM

## 2021-07-06 DIAGNOSIS — E785 Hyperlipidemia, unspecified: Secondary | ICD-10-CM

## 2021-07-06 DIAGNOSIS — E66813 Obesity, class 3: Secondary | ICD-10-CM

## 2021-07-06 MED ORDER — TIRZEPATIDE 5 MG/0.5ML ~~LOC~~ SOAJ: 5.0000 mg | SUBCUTANEOUS | 1 refills | Status: DC

## 2021-07-06 MED ORDER — TIRZEPATIDE 2.5 MG/0.5ML ~~LOC~~ SOAJ: 2.5000 mg | SUBCUTANEOUS | 0 refills | Status: DC

## 2021-07-06 NOTE — Patient Instructions (Signed)

## 2021-07-06 NOTE — Progress Notes (Signed)
Subjective:     Sandra Hartman is a 60 y.o. female and is here for a comprehensive physical exam. The patient reports no problems.    Social History   Socioeconomic History   Marital status: Single    Spouse name: Not on file   Number of children: Not on file   Years of education: Not on file   Highest education level: Not on file  Occupational History   Not on file  Tobacco Use   Smoking status: Never   Smokeless tobacco: Never  Substance and Sexual Activity   Alcohol use: No   Drug use: No   Sexual activity: Not Currently  Other Topics Concern   Not on file  Social History Narrative   Not on file   Social Determinants of Health   Financial Resource Strain: Not on file  Food Insecurity: Not on file  Transportation Needs: Not on file  Physical Activity: Not on file  Stress: Not on file  Social Connections: Not on file  Intimate Partner Violence: Not on file   Health Maintenance  Topic Date Due   COVID-19 Vaccine (3 - Booster for Moderna series) 07/22/2021 (Originally 03/11/2020)   Zoster Vaccines- Shingrix (1 of 2) 10/04/2021 (Originally 01/08/2010)   INFLUENZA VACCINE  10/28/2021 (Originally 02/28/2021)   HIV Screening  07/06/2022 (Originally 01/09/1975)   TETANUS/TDAP  04/04/2026 (Originally 01/09/1979)   MAMMOGRAM  02/10/2023   COLONOSCOPY (Pts 45-5yrs Insurance coverage will need to be confirmed)  12/07/2024   Hepatitis C Screening  Completed   Pneumococcal Vaccine 84-75 Years old  Aged Out   HPV VACCINES  Aged Out    The following portions of the patient's history were reviewed and updated as appropriate: allergies, current medications, past family history, past medical history, past social history, past surgical history, and problem list.  Review of Systems A comprehensive review of systems was negative.   Objective:    BP (!) 164/89   Pulse 90   Ht 5\' 6"  (1.676 m)   Wt 250 lb (113.4 kg)   LMP 10/25/2012   SpO2 100%   BMI 40.35 kg/m  General  appearance: alert, cooperative, appears stated age, and morbidly obese Head: Normocephalic, without obvious abnormality, atraumatic Eyes: conjunctivae/corneas clear. PERRL, EOM's intact. Fundi benign. Ears: normal TM's and external ear canals both ears Nose: Nares normal. Septum midline. Mucosa normal. No drainage or sinus tenderness. Throat: lips, mucosa, and tongue normal; teeth and gums normal Neck: no adenopathy, no carotid bruit, no JVD, supple, symmetrical, trachea midline, and thyroid not enlarged, symmetric, no tenderness/mass/nodules Back: symmetric, no curvature. ROM normal. No CVA tenderness. Lungs: clear to auscultation bilaterally Heart: regular rate and rhythm, S1, S2 normal, no murmur, click, rub or gallop Abdomen: soft, non-tender; bowel sounds normal; no masses,  no organomegaly Extremities: extremities normal, atraumatic, no cyanosis or edema Pulses: 2+ and symmetric Skin: Skin color, texture, turgor normal. No rashes or lesions Lymph nodes: Cervical, supraclavicular, and axillary nodes normal. Neurologic: Alert and oriented X 3, normal strength and tone. Normal symmetric reflexes. Normal coordination and gait   .Marland Kitchen Depression screen [facility] 2/9 06/30/2020 06/25/2019 06/24/2018 10/23/2017 04/03/2017  Decreased Interest 0 0 0 0 0  Down, Depressed, Hopeless 0 0 0 0 0  PHQ - 2 Score 0 0 0 0 0  Altered sleeping 0 0 0 0 -  Tired, decreased energy 0 0 0 0 -  Change in appetite 0 0 0 0 -  Feeling bad or failure about yourself  0  0 0 0 -  Trouble concentrating 0 0 0 0 -  Moving slowly or fidgety/restless 0 0 0 0 -  Suicidal thoughts 0 0 0 0 -  PHQ-9 Score 0 0 0 0 -  Difficult doing work/chores Not difficult at all Not difficult at all Not difficult at all - -   .Marland Kitchen GAD 7 : Generalized Anxiety Score 06/30/2020 06/25/2019 06/24/2018  Nervous, Anxious, on Edge 0 0 0  Control/stop worrying 0 0 0  Worry too much - different things 0 0 0  Trouble relaxing 0 0 0  Restless 0 0 0  Easily  annoyed or irritable 0 0 0  Afraid - awful might happen 0 0 0  Total GAD 7 Score 0 0 0  Anxiety Difficulty Not difficult at all Not difficult at all Not difficult at all      Assessment:    Healthy female exam.     Plan:    Marland KitchenMarland KitchenKarrigan was seen today for annual exam.  Diagnoses and all orders for this visit:  Routine physical examination -     Lipid Panel w/reflex Direct LDL -     Hemoglobin A1c -     COMPLETE METABOLIC PANEL WITH GFR -     VITAMIN D 25 Hydroxy (Vit-D Deficiency, Fractures) -     TSH -     CBC  MS (multiple sclerosis) (HCC)  White coat syndrome without diagnosis of hypertension  Dyslipidemia (high LDL; low HDL) -     Lipid Panel w/reflex Direct LDL  Elevated fasting glucose -     Hemoglobin A1c  Post-menopausal -     VITAMIN D 25 Hydroxy (Vit-D Deficiency, Fractures)  Class 3 severe obesity due to excess calories without serious comorbidity with body mass index (BMI) of 40.0 to 44.9 in adult (HCC) -     tirzepatide (MOUNJARO) 2.5 MG/0.5ML Pen; Inject 2.5 mg into the skin once a week. -     tirzepatide [facility]) 5 MG/0.5ML Pen; Inject 5 mg into the skin once a week. -     TSH  .Marland Kitchen Discussed 150 minutes of exercise a week.  Encouraged vitamin D 1000 units and Calcium 1300mg  or 4 servings of dairy a day.  Phq/gad no concerns.  Fasting labs ordered today BP elevated but per patient at home in the 120s of 80s. Pap not indicated. Mammogram UTD. Colonoscopy UTD. Declines flu, pneumonia, shingles, covid vaccines.   Marland Kitchen.Discussed low carb diet with 1500 calories and 80g of protein.  Exercising at least 150 minutes a week.  My Fitness Pal could be a Microbiologist.  Discussed medications.  Agreed to start mounjaro with coupon card.  Follow up in 3 months.  Pt aware of tiration.   See After Visit Summary for Counseling Recommendations

## 2021-07-07 LAB — COMPLETE METABOLIC PANEL WITH GFR
AG Ratio: 1.5 (calc) (ref 1.0–2.5)
ALT: 12 U/L (ref 6–29)
AST: 15 U/L (ref 10–35)
Albumin: 4.1 g/dL (ref 3.6–5.1)
Alkaline phosphatase (APISO): 71 U/L (ref 37–153)
BUN: 12 mg/dL (ref 7–25)
CO2: 23 mmol/L (ref 20–32)
Calcium: 9.6 mg/dL (ref 8.6–10.4)
Chloride: 105 mmol/L (ref 98–110)
Creat: 0.85 mg/dL (ref 0.50–1.05)
Globulin: 2.7 g/dL (calc) (ref 1.9–3.7)
Glucose, Bld: 91 mg/dL (ref 65–99)
Potassium: 4.6 mmol/L (ref 3.5–5.3)
Sodium: 141 mmol/L (ref 135–146)
Total Bilirubin: 0.6 mg/dL (ref 0.2–1.2)
Total Protein: 6.8 g/dL (ref 6.1–8.1)
eGFR: 78 mL/min/{1.73_m2} (ref >=60)

## 2021-07-07 LAB — CBC
HCT: 40.3 % (ref 35.0–45.0)
Hemoglobin: 13.9 g/dL (ref 11.7–15.5)
MCH: 32.3 pg (ref 27.0–33.0)
MCHC: 34.5 g/dL (ref 32.0–36.0)
MCV: 93.5 fL (ref 80.0–100.0)
MPV: 11.5 fL (ref 7.5–12.5)
Platelets: 288 10*3/uL (ref 140–400)
RBC: 4.31 10*6/uL (ref 3.80–5.10)
RDW: 12.1 % (ref 11.0–15.0)
WBC: 6.1 10*3/uL (ref 3.8–10.8)

## 2021-07-07 LAB — VITAMIN D 25 HYDROXY (VIT D DEFICIENCY, FRACTURES): Vit D, 25-Hydroxy: 59 ng/mL (ref 30–100)

## 2021-07-07 LAB — HEMOGLOBIN A1C
Hgb A1c MFr Bld: 5.4 % of total Hgb (ref <5.7)
Mean Plasma Glucose: 108 mg/dL
eAG (mmol/L): 6 mmol/L

## 2021-07-07 LAB — LIPID PANEL W/REFLEX DIRECT LDL
Cholesterol: 260 mg/dL — ABNORMAL HIGH (ref <200)
HDL: 52 mg/dL (ref >=50)
LDL Cholesterol (Calc): 175 mg/dL (calc) — ABNORMAL HIGH
Non-HDL Cholesterol (Calc): 208 mg/dL (calc) — ABNORMAL HIGH (ref <130)
Total CHOL/HDL Ratio: 5 (calc) — ABNORMAL HIGH (ref <5.0)
Triglycerides: 179 mg/dL — ABNORMAL HIGH (ref <150)

## 2021-07-07 LAB — TSH: TSH: 1.57 mIU/L (ref 0.40–4.50)

## 2021-07-08 NOTE — Progress Notes (Signed)
Lilybelle,   A1C stable and normal range.  Kidney, liver, glucose look great.  Vitamin D normal.  HDL 52 which is good.  LDL elevated at 175.  TG down from 1 year ago.  I continue to recommend a statin for lower cholesterol and cardiovascular risk reduction.

## 2021-07-11 ENCOUNTER — Telehealth: Payer: Self-pay

## 2021-07-11 ENCOUNTER — Ambulatory Visit: Payer: BC Managed Care – PPO | Admitting: Sports Medicine

## 2021-07-11 NOTE — Telephone Encounter (Signed)
Medication: tirzepatide Sandra Hartman) Prior authorization submitted via CoverMyMeds on 07/11/2021 PA submission pending

## 2021-07-12 ENCOUNTER — Ambulatory Visit (INDEPENDENT_AMBULATORY_CARE_PROVIDER_SITE_OTHER): Payer: BC Managed Care – PPO

## 2021-07-12 ENCOUNTER — Ambulatory Visit: Payer: Self-pay

## 2021-07-12 ENCOUNTER — Ambulatory Visit (INDEPENDENT_AMBULATORY_CARE_PROVIDER_SITE_OTHER): Payer: BC Managed Care – PPO | Admitting: Sports Medicine

## 2021-07-12 ENCOUNTER — Encounter: Payer: Self-pay | Admitting: Sports Medicine

## 2021-07-12 ENCOUNTER — Other Ambulatory Visit: Payer: Self-pay

## 2021-07-12 DIAGNOSIS — M25512 Pain in left shoulder: Secondary | ICD-10-CM

## 2021-07-12 DIAGNOSIS — G8929 Other chronic pain: Secondary | ICD-10-CM

## 2021-07-12 DIAGNOSIS — M533 Sacrococcygeal disorders, not elsewhere classified: Secondary | ICD-10-CM

## 2021-07-12 MED ORDER — TRAMADOL HCL 50 MG PO TABS: 50.0000 mg | ORAL_TABLET | Freq: Three times a day (TID) | ORAL | 0 refills | Status: DC | PRN

## 2021-07-12 NOTE — Progress Notes (Addendum)
    Procedures performed today:    Procedure: Real-time Ultrasound Guided injection of the left sacroiliac joint Device: Samsung HS60  Verbal informed consent obtained.  Time-out conducted.  Noted no overlying erythema, induration, or other signs of local infection.  Skin prepped in a sterile fashion.  Local anesthesia: Topical Ethyl chloride.  With sterile technique and under real time ultrasound guidance: Noted normal-appearing joint, 1 cc Kenalog 40, 2 cc lidocaine, 2 cc bupivacaine injected easily Completed without difficulty  Advised to call if fevers/chills, erythema, induration, drainage, or persistent bleeding.  Images permanently stored and available for review in PACS.  Impression: Technically successful ultrasound guided injection.  Procedure: Real-time Ultrasound Guided injection of the right sacroiliac joint Device: Samsung HS60  Verbal informed consent obtained.  Time-out conducted.  Noted no overlying erythema, induration, or other signs of local infection.  Skin prepped in a sterile fashion.  Local anesthesia: Topical Ethyl chloride.  With sterile technique and under real time ultrasound guidance: Noted normal-appearing joint, 1 cc Kenalog 40, 2 cc lidocaine, 2 cc bupivacaine injected easily Completed without difficulty  Advised to call if fevers/chills, erythema, induration, drainage, or persistent bleeding.  Images permanently stored and available for review in PACS.  Impression: Technically successful ultrasound guided injection.  Independent interpretation of notes and tests performed by another provider:   None.  Brief History, Exam, Impression, and Recommendations:    Sacroiliac joint dysfunction of both sides This is a pleasant 60 year old female, she has known bilateral SI joint dysfunction, did really well in injections back in October 2021, recurrence of pain, repeated today bilaterally.  Left shoulder pain Worse over the deltoid, positive impingement  signs, adding x-rays, rotator cuff conditioning, adding tramadol for breakthrough pain. Return if no better in 6 weeks.    ___________________________________________ Sandra Hartman. Dianah Field, M.D., ABFM., CAQSM. Primary Care and [place] Instructor of [place] [facility] of [employer]

## 2021-07-12 NOTE — Assessment & Plan Note (Addendum)
Worse over the deltoid, positive impingement signs, adding x-rays, rotator cuff conditioning, adding tramadol for breakthrough pain. Return if no better in 6 weeks.

## 2021-07-12 NOTE — Addendum Note (Signed)
Addended by: Silverio Decamp on: 07/12/2021 09:22 AM   Modules accepted: Orders

## 2021-07-12 NOTE — Assessment & Plan Note (Signed)
This is a pleasant 60 year old female, she has known bilateral SI joint dysfunction, did really well in injections back in October 2021, recurrence of pain, repeated today bilaterally.

## 2021-07-14 ENCOUNTER — Ambulatory Visit (INDEPENDENT_AMBULATORY_CARE_PROVIDER_SITE_OTHER): Payer: BC Managed Care – PPO | Admitting: Podiatry

## 2021-07-14 ENCOUNTER — Encounter: Payer: Self-pay | Admitting: Podiatry

## 2021-07-14 ENCOUNTER — Other Ambulatory Visit: Payer: Self-pay

## 2021-07-14 DIAGNOSIS — B351 Tinea unguium: Secondary | ICD-10-CM

## 2021-07-14 MED ORDER — TERBINAFINE HCL 250 MG PO TABS: 250.0000 mg | ORAL_TABLET | Freq: Every day | ORAL | 2 refills | Status: DC

## 2021-07-14 NOTE — Progress Notes (Signed)
°  Subjective:  Patient ID: Sandra Hartman, female    DOB: November 15, 1960,   MRN: [id number]  Chief Complaint  Patient presents with   Nail Problem    Right foot great toe nail fungus - has been there ongoing for 1 year - left foot third toe possible fungus with white marks , patient states she is not sure if it is a fungus but she is concern      60 y.o. female presents for concern of fungal nails. Relates it is her right great toe and left third toe. She has tried OTC treatments with relief on some nails but these nails seems to not be completely improving. Here today to discuss more options . Denies any other pedal complaints. Denies n/v/f/c.   Past Medical History:  Diagnosis Date   Degenerative joint disease (DJD) of hip 10/30/2017   MS (multiple sclerosis) (HCC)    Obesity t    Objective:  Physical Exam: Vascular: DP/PT pulses 2/4 bilateral. CFT <3 seconds. Normal hair growth on digits. No edema.  Skin. No lacerations or abrasions bilateral feet. Right hallux nails and left third digit with discoloration and subungual debris noted.  Musculoskeletal: MMT 5/5 bilateral lower extremities in DF, PF, Inversion and Eversion. Deceased ROM in DF of ankle joint.  Neurological: Sensation intact to light touch.   Assessment:   1. Onychomycosis      Plan:  Patient was evaluated and treated and all questions answered. -Examined patient -Discussed treatment options for painful dystrophic nails  -Discussed fungal nail treatment options including oral, topical, and laser treatments.  -Patient would like to try oral lamisil  -LFTs reviewed and normal range  -Prescription for lamisil provided.  -Patient to return in 3 months for re-evaluation.    Lorenda Peck, DPM

## 2021-07-19 ENCOUNTER — Encounter: Payer: Self-pay | Admitting: Podiatry

## 2021-07-19 MED ORDER — TERBINAFINE HCL 250 MG PO TABS: 250.0000 mg | ORAL_TABLET | Freq: Every day | ORAL | 0 refills | Status: AC

## 2021-07-21 NOTE — Telephone Encounter (Signed)
Medication: tirzepatide Darcel Bayley) Prior authorization determination received Medication has been denied Reason for denial:  "Coverage is provided for type 2 diabetes mellitus. Coverage cannot be authorized at this time."

## 2021-07-27 DIAGNOSIS — H2513 Age-related nuclear cataract, bilateral: Secondary | ICD-10-CM | POA: Diagnosis not present

## 2021-08-06 ENCOUNTER — Telehealth: Payer: Self-pay

## 2021-08-06 NOTE — Telephone Encounter (Signed)
Medication: tirzepatide Darcel Bayley) 5 MG/0.5ML Pen Prior authorization submitted via CoverMyMeds on 08/06/2021 PA submission pending

## 2021-08-08 NOTE — Telephone Encounter (Signed)
Medication: tirzepatide Darcel Bayley) 5 MG/0.5ML Pen Prior authorization determination received Medication has been denied Reason for denial:  "Coverage is provided for type 2 diabetes mellitus. Coverage cannot be authorized at this time."

## 2021-10-13 ENCOUNTER — Ambulatory Visit: Payer: BC Managed Care – PPO | Admitting: Podiatry

## 2022-02-13 ENCOUNTER — Other Ambulatory Visit (HOSPITAL_BASED_OUTPATIENT_CLINIC_OR_DEPARTMENT_OTHER): Payer: Self-pay | Admitting: Physician Assistant

## 2022-02-13 DIAGNOSIS — Z1231 Encounter for screening mammogram for malignant neoplasm of breast: Secondary | ICD-10-CM

## 2022-02-16 ENCOUNTER — Ambulatory Visit (INDEPENDENT_AMBULATORY_CARE_PROVIDER_SITE_OTHER): Payer: BC Managed Care – PPO

## 2022-02-16 DIAGNOSIS — Z1231 Encounter for screening mammogram for malignant neoplasm of breast: Secondary | ICD-10-CM

## 2022-02-17 NOTE — Progress Notes (Signed)
Normal mammogram. Follow up in 1 year.

## 2022-03-20 ENCOUNTER — Encounter: Payer: Self-pay | Admitting: Physician Assistant

## 2022-07-12 ENCOUNTER — Ambulatory Visit (INDEPENDENT_AMBULATORY_CARE_PROVIDER_SITE_OTHER): Payer: BC Managed Care – PPO | Admitting: Physician Assistant

## 2022-07-12 ENCOUNTER — Other Ambulatory Visit: Payer: Self-pay | Admitting: Neurology

## 2022-07-12 VITALS — BP 162/94 | HR 106 | Ht 66.0 in | Wt 275.0 lb

## 2022-07-12 DIAGNOSIS — Z6841 Body Mass Index (BMI) 40.0 and over, adult: Secondary | ICD-10-CM

## 2022-07-12 DIAGNOSIS — G8929 Other chronic pain: Secondary | ICD-10-CM

## 2022-07-12 DIAGNOSIS — R030 Elevated blood-pressure reading, without diagnosis of hypertension: Secondary | ICD-10-CM

## 2022-07-12 DIAGNOSIS — R7301 Impaired fasting glucose: Secondary | ICD-10-CM | POA: Diagnosis not present

## 2022-07-12 DIAGNOSIS — Z1329 Encounter for screening for other suspected endocrine disorder: Secondary | ICD-10-CM | POA: Diagnosis not present

## 2022-07-12 DIAGNOSIS — G5793 Unspecified mononeuropathy of bilateral lower limbs: Secondary | ICD-10-CM

## 2022-07-12 DIAGNOSIS — M25512 Pain in left shoulder: Secondary | ICD-10-CM

## 2022-07-12 DIAGNOSIS — G35 Multiple sclerosis: Secondary | ICD-10-CM

## 2022-07-12 DIAGNOSIS — E785 Hyperlipidemia, unspecified: Secondary | ICD-10-CM

## 2022-07-12 DIAGNOSIS — Z0000 Encounter for general adult medical examination without abnormal findings: Secondary | ICD-10-CM

## 2022-07-12 DIAGNOSIS — M47816 Spondylosis without myelopathy or radiculopathy, lumbar region: Secondary | ICD-10-CM

## 2022-07-12 MED ORDER — PREDNISONE 20 MG PO TABS
ORAL_TABLET | ORAL | 0 refills | Status: DC
Start: 2022-07-12 — End: 2022-09-20

## 2022-07-12 MED ORDER — DICLOFENAC SODIUM 75 MG PO TBEC: 75.0000 mg | DELAYED_RELEASE_TABLET | Freq: Two times a day (BID) | ORAL | 3 refills | Status: AC

## 2022-07-12 MED ORDER — TRAMADOL HCL 50 MG PO TABS: 50.0000 mg | ORAL_TABLET | Freq: Three times a day (TID) | ORAL | 0 refills | Status: DC | PRN

## 2022-07-12 NOTE — Progress Notes (Signed)
Complete physical exam  Patient: Sandra Hartman   DOB: 04-Dec-1960   61 y.o. Female  MRN: [id number]  Subjective:    Chief Complaint  Patient presents with   Annual Exam    Sandra Hartman is a 61 y.o. female who presents today for a complete physical exam. She reports consuming a general diet. The patient does not participate in regular exercise at present. She generally feels poorly. She reports sleeping fairly well. She does not have additional problems to discuss today.   She will schedule a follow up appt to discuss GI symptoms and worsening MS symptoms. Concerning about mobility and bilateral feet numbness.    Most recent fall risk assessment:    07/12/2022    8:44 AM  Fall Risk   Falls in the past year? 0  Number falls in past yr: 0  Injury with Fall? 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 06/28/2019   Post-menopausal 06/24/2018   Vitamin D deficiency 06/24/2018   Primary osteoarthritis of left hip 10/30/2017   Dyslipidemia (high LDL; low HDL) 04/05/2017   BMI 38.0-38.9,adult 04/03/2017   Sacroiliac joint dysfunction of both sides 07/23/2015   RLQ abdominal pain 06/02/2015   Complex atypical endometrial hyperplasia 04/12/2015   Colon polyp 12/08/2014   Family history of uterine cancer 12/05/2014   Thickened endometrium 12/05/2014   Bilateral  lower abdominal pain 12/05/2014   Abdominal pain, epigastric 12/05/2014   LUQ abdominal pain 12/05/2014   Obesity 10/21/2014   Squeezing chest pain 09/06/2014   Anxiety state 09/02/2014   Fluttering muscles 09/02/2014   White coat syndrome without diagnosis of hypertension 06/30/2013   MS (multiple sclerosis) ([place])    Past Medical History:  Diagnosis Date   Degenerative joint disease (DJD) of hip 10/30/2017   MS (multiple sclerosis) (HCC)    Obesity t   Family History  Problem Relation Age of Onset   Cancer Mother    Heart attack Father    Allergies  Allergen Reactions   Sleep Medicine [[place]]       Patient Care Team: Lavada Mesi as PCP - General (Family Medicine)   Outpatient Medications Prior to Visit  Medication Sig   cholecalciferol (VITAMIN D) 1000 units tablet Take 2,000 Units by mouth daily.   Multiple Vitamin (MULTI-VITAMINS) TABS Take by mouth.   [DISCONTINUED] ibuprofen (ADVIL,MOTRIN) 200 MG tablet Take by mouth.   [DISCONTINUED] tirzepatide [facility]) 5 MG/0.5ML Pen Inject 5 mg into the skin once a week.   [DISCONTINUED] traMADol (ULTRAM) 50 MG tablet Take 1-2 tablets (50-100 mg total) by mouth every 8 (eight) hours as needed for moderate pain. Maximum 6 tabs per day.   [DISCONTINUED] tirzepatide [facility]) 2.5 MG/0.5ML Pen Inject 2.5 mg into the skin once a week.  No facility-administered medications prior to visit.    ROS  Bilateral feet numbness/fatigue.       Objective:     BP (!) 162/94   Pulse (!) 106   Ht '5\' 6"'$  (1.676 m)   Wt 275 lb (124.7 kg)   LMP 09/28/2012 (Approximate)   SpO2 97%   BMI 44.39 kg/m  BP Readings from Last 3 Encounters:  07/12/22 (!) 162/94  07/06/21 (!) 164/89  06/30/20 (!) 179/94   Wt Readings from Last 3 Encounters:  07/12/22 275 lb (124.7 kg)  07/06/21 250 lb (113.4 kg)  06/30/20 282 lb (127.9 kg)      Physical Exam  BP (!) 162/94   Pulse (!) 106   Ht '5\' 6"'$  (1.676 m)   Wt 275 lb (124.7 kg)    LMP 09/28/2012 (Approximate)   SpO2 97%   BMI 44.39 kg/m   General Appearance:    Alert, cooperative, no distress, obese appears stated age  Head:    Normocephalic, without obvious abnormality, atraumatic  Eyes:    PERRL, conjunctiva/corneas clear, EOM's intact, fundi    benign, both eyes  Ears:    Normal TM's and external ear canals, both ears  Nose:   Nares normal, septum midline, mucosa normal, no drainage    or sinus tenderness  Throat:   Lips, mucosa, and tongue normal; teeth and gums normal  Neck:   Supple, symmetrical, trachea midline, no adenopathy;    thyroid:  no enlargement/tenderness/nodules; no carotid   bruit or JVD  Back:     Symmetric, no curvature, ROM normal, no CVA tenderness  Lungs:     Clear to auscultation bilaterally, respirations unlabored  Chest Wall:    No tenderness or deformity   Heart:    Regular rate and rhythm, S1 and S2 normal, no murmur, rub   or gallop     Abdomen:     Soft, non-tender, bowel sounds active all four quadrants,    no masses, no organomegaly        Extremities:   Extremities normal, atraumatic, no cyanosis or edema  Pulses:   2+ and symmetric all extremities  Skin:   Skin color, texture, turgor normal, no rashes or lesions  Lymph nodes:   Cervical, supraclavicular, and axillary nodes normal  Neurologic:   CNII-XII intact, normal strength, sensation and reflexes    throughout      Assessment & Plan:    Routine Health Maintenance and Physical Exam  Immunization History  Administered Date(s) Administered   Moderna Sars-Covid-2 Vaccination 12/18/2019, 01/15/2020    Health Maintenance  Topic Date Due   COVID-19 Vaccine (3 - 2023-24 season) 07/28/2022 (Originally 03/31/2022)   Zoster Vaccines- Shingrix (1 of 2) 10/11/2022 (Originally 01/08/2010)   INFLUENZA VACCINE  10/29/2022 (Originally 02/28/2022)   HIV Screening  07/13/2023 (Originally 01/09/1975)   MAMMOGRAM  02/17/2024   COLONOSCOPY (Pts 45-59yr Insurance coverage will need  to be confirmed)  12/07/2024   Hepatitis C Screening  Completed   HPV VACCINES  Aged Out   DTaP/Tdap/Td  Discontinued    Discussed health benefits of physical activity, and encouraged her to engage in regular exercise appropriate for her age and condition.  .Marland KitchenIvin Bootywas seen today for annual exam.  Diagnoses and all orders for this visit:  Routine physical examination -     TSH -     Lipid Panel w/reflex Direct LDL -     COMPLETE METABOLIC PANEL WITH GFR -     CBC with  Differential/Platelet  Dyslipidemia (high LDL; low HDL) -     Lipid Panel w/reflex Direct LDL  Elevated fasting glucose -     COMPLETE METABOLIC PANEL WITH GFR  Thyroid disorder screen -     TSH  Chronic left shoulder pain -     Discontinue: traMADol (ULTRAM) 50 MG tablet; Take 1-2 tablets (50-100 mg total) by mouth every 8 (eight) hours as needed for moderate pain. Maximum 6 tabs per day.  MS (multiple sclerosis) (HCC) -     predniSONE (DELTASONE) 20 MG tablet; Take 3 tablets for 3 days, take 2 tablets for 3 days, take 1 tablet for 3 days, take 1/2 tablet for 4 days.  White coat syndrome without diagnosis of hypertension  Lumbar spondylosis -     predniSONE (DELTASONE) 20 MG tablet; Take 3 tablets for 3 days, take 2 tablets for 3 days, take 1 tablet for 3 days, take 1/2 tablet for 4 days. -     diclofenac (VOLTAREN) 75 MG EC tablet; Take 1 tablet (75 mg total) by mouth 2 (two) times daily.  Class 3 severe obesity due to excess calories without serious comorbidity with body mass index (BMI) of 40.0 to 44.9 in adult [facility])  Neuropathy of both feet   .Marland Kitchen Discussed 150 minutes of exercise a week.  Encouraged vitamin D 1000 units and Calcium '1300mg'$  or 4 servings of dairy a day.  Fasting labs ordered.  PHQ no concerns Mammogram UTD Declined all vaccines.  Home BP in 120/80 but she  reports hx of white coat HTN.   Marland Kitchen.Discussed low carb diet with 1500 calories and 80g of protein.  Exercising at least 150  minutes a week.  My Fitness Pal could be a Microbiologist.  Declined referral to bariatrics or health and wellness Insurance declines all weight loss medications.   MS flare Sees Dr. Lynnette Caffey and declined medication at last visit Consider PT for neuropathy, lyrica, gabapentin, SSNRI.  Prednisone burst for flare. Diclofenac to use after daily  Lumbar spondylosis/left shoulder/chronic pain Refilled tramadol as needed Follow up with Dr. Darene Lamer ..PDMP reviewed during this encounter. No concerns  Return in about 1 year (around 07/13/2023).     Iran Planas, PA-C

## 2022-07-12 NOTE — Telephone Encounter (Signed)
[facility] called and LVM stating they can not fill patient's Tramadol RX. They state as an initial start they only allow 40 MME's a day and this RX is 60. Please advise.

## 2022-07-12 NOTE — Patient Instructions (Signed)

## 2022-07-13 LAB — CBC WITH DIFFERENTIAL/PLATELET
Absolute Monocytes: 403 cells/uL (ref 200–950)
Basophils Absolute: 99 cells/uL (ref 0–200)
Basophils Relative: 1.3 %
Eosinophils Absolute: 228 cells/uL (ref 15–500)
Eosinophils Relative: 3 %
HCT: 43.5 % (ref 35.0–45.0)
Hemoglobin: 15 g/dL (ref 11.7–15.5)
Lymphs Abs: 2037 cells/uL (ref 850–3900)
MCH: 33 pg (ref 27.0–33.0)
MCHC: 34.5 g/dL (ref 32.0–36.0)
MCV: 95.6 fL (ref 80.0–100.0)
MPV: 10.8 fL (ref 7.5–12.5)
Monocytes Relative: 5.3 %
Neutro Abs: 4834 cells/uL (ref 1500–7800)
Neutrophils Relative %: 63.6 %
Platelets: 300 10*3/uL (ref 140–400)
RBC: 4.55 10*6/uL (ref 3.80–5.10)
RDW: 11.9 % (ref 11.0–15.0)
Total Lymphocyte: 26.8 %
WBC: 7.6 10*3/uL (ref 3.8–10.8)

## 2022-07-13 LAB — COMPLETE METABOLIC PANEL WITH GFR
AG Ratio: 1.5 (calc) (ref 1.0–2.5)
ALT: 36 U/L — ABNORMAL HIGH (ref 6–29)
AST: 16 U/L (ref 10–35)
Albumin: 4.5 g/dL (ref 3.6–5.1)
Alkaline phosphatase (APISO): 87 U/L (ref 37–153)
BUN: 11 mg/dL (ref 7–25)
CO2: 29 mmol/L (ref 20–32)
Calcium: 10.2 mg/dL (ref 8.6–10.4)
Chloride: 103 mmol/L (ref 98–110)
Creat: 0.94 mg/dL (ref 0.50–1.05)
Globulin: 3 g/dL (calc) (ref 1.9–3.7)
Glucose, Bld: 95 mg/dL (ref 65–99)
Potassium: 4.8 mmol/L (ref 3.5–5.3)
Sodium: 142 mmol/L (ref 135–146)
Total Bilirubin: 0.6 mg/dL (ref 0.2–1.2)
Total Protein: 7.5 g/dL (ref 6.1–8.1)
eGFR: 69 mL/min/{1.73_m2} (ref >=60)

## 2022-07-13 LAB — LIPID PANEL W/REFLEX DIRECT LDL
Cholesterol: 270 mg/dL — ABNORMAL HIGH (ref <200)
HDL: 55 mg/dL (ref >=50)
LDL Cholesterol (Calc): 175 mg/dL (calc) — ABNORMAL HIGH
Non-HDL Cholesterol (Calc): 215 mg/dL (calc) — ABNORMAL HIGH (ref <130)
Total CHOL/HDL Ratio: 4.9 (calc) (ref <5.0)
Triglycerides: 234 mg/dL — ABNORMAL HIGH (ref <150)

## 2022-07-13 LAB — TSH: TSH: 1.62 mIU/L (ref 0.40–4.50)

## 2022-07-14 MED ORDER — TRAMADOL HCL 50 MG PO TABS: 50.0000 mg | ORAL_TABLET | Freq: Four times a day (QID) | ORAL | 0 refills | Status: DC | PRN

## 2022-07-14 NOTE — Addendum Note (Signed)
Addended byAnnamaria Helling on: 07/14/2022 10:39 AM   Modules accepted: Orders

## 2022-07-14 NOTE — Addendum Note (Signed)
Addended by: Donella Stade on: 07/14/2022 10:41 AM   Modules accepted: Orders

## 2022-07-14 NOTE — Progress Notes (Signed)
Sandra Hartman,   Normal hemoglobin and WBC.  Normal thyroid.  ALT up a little from one year ago. Recheck in 4-6 weeks.  Cholesterol still elevated. 10 year risk of CV event is 7.8 percent. Would you consider medication to help decrease CV risk and lower cholesterol?   Marland Kitchen.The 10-year ASCVD risk score (Arnett DK, et al., 2019) is: 7.8%   Values used to calculate the score:     Age: 61 years     Sex: Female     Is Non-Hispanic African American: No     Diabetic: No     Tobacco smoker: No     Systolic Blood Pressure: 999 mmHg     Is BP treated: No     HDL Cholesterol: 55 mg/dL     Total Cholesterol: 270 mg/dL

## 2022-07-17 DIAGNOSIS — G5793 Unspecified mononeuropathy of bilateral lower limbs: Secondary | ICD-10-CM | POA: Insufficient documentation

## 2022-09-11 ENCOUNTER — Ambulatory Visit (INDEPENDENT_AMBULATORY_CARE_PROVIDER_SITE_OTHER): Payer: BC Managed Care – PPO

## 2022-09-11 ENCOUNTER — Ambulatory Visit (INDEPENDENT_AMBULATORY_CARE_PROVIDER_SITE_OTHER): Payer: BC Managed Care – PPO | Admitting: Sports Medicine

## 2022-09-11 ENCOUNTER — Encounter: Payer: Self-pay | Admitting: Sports Medicine

## 2022-09-11 DIAGNOSIS — M1612 Unilateral primary osteoarthritis, left hip: Secondary | ICD-10-CM | POA: Diagnosis not present

## 2022-09-11 DIAGNOSIS — M47816 Spondylosis without myelopathy or radiculopathy, lumbar region: Secondary | ICD-10-CM | POA: Diagnosis not present

## 2022-09-11 MED ORDER — TRIAMCINOLONE ACETONIDE 40 MG/ML IJ SUSP
40.0000 mg | Freq: Once | INTRAMUSCULAR | Status: AC
  Administered 2022-09-11: 40 mg via INTRAMUSCULAR

## 2022-09-11 NOTE — Progress Notes (Signed)
    Procedures performed today:    Procedure: Real-time Ultrasound Guided injection of the left hip joint Device: Samsung HS60  Verbal informed consent obtained.  Time-out conducted.  Noted no overlying erythema, induration, or other signs of local infection.  Skin prepped in a sterile fashion.  Local anesthesia: Topical Ethyl chloride.  With sterile technique and under real time ultrasound guidance: Noted arthritic joint, 1 cc Kenalog 40, 2 cc lidocaine, 2 cc bupivacaine injected easily Completed without difficulty  Advised to call if fevers/chills, erythema, induration, drainage, or persistent bleeding.  Images permanently stored and available for review in PACS.  Impression: Technically successful ultrasound guided injection.  Independent interpretation of notes and tests performed by another provider:   None.  Brief History, Exam, Impression, and Recommendations:    Primary osteoarthritis of left hip Very pleasant 62 year old female, she last had a hip joint injection back in 2021 with Dr. Georgina Snell, she has done well until recently, now having increasing pain left groin, worse with internal rotation on exam. I injected her left hip joint today, adding home physical therapy, updated x-rays, return to see me in 6 weeks as needed.    ____________________________________________ Gwen Her. Dianah Field, M.D., ABFM., CAQSM., AME. Primary Care and Sports Medicine [facility]  Adjunct Professor of [employer] [facility] [employer] Medicine  Risk manager

## 2022-09-11 NOTE — Assessment & Plan Note (Signed)
Very pleasant 62 year old female, she last had a hip joint injection back in 2021 with Dr. Georgina Snell, she has done well until recently, now having increasing pain left groin, worse with internal rotation on exam. I injected her left hip joint today, adding home physical therapy, updated x-rays, return to see me in 6 weeks as needed.

## 2022-09-11 NOTE — Addendum Note (Signed)
Addended by: Tarri Glenn A on: 09/11/2022 09:47 AM   Modules accepted: Orders

## 2022-09-14 ENCOUNTER — Encounter: Payer: Self-pay | Admitting: Physician Assistant

## 2022-09-14 DIAGNOSIS — R0781 Pleurodynia: Secondary | ICD-10-CM

## 2022-09-14 DIAGNOSIS — R0789 Other chest pain: Secondary | ICD-10-CM

## 2022-09-18 ENCOUNTER — Ambulatory Visit (INDEPENDENT_AMBULATORY_CARE_PROVIDER_SITE_OTHER): Payer: BC Managed Care – PPO

## 2022-09-18 DIAGNOSIS — R0781 Pleurodynia: Secondary | ICD-10-CM

## 2022-09-18 DIAGNOSIS — R0789 Other chest pain: Secondary | ICD-10-CM

## 2022-09-20 NOTE — Progress Notes (Signed)
Normal CXR. No acute findings. Can we schedule a virtual visit to discuss and talk about further work up or medication trials?

## 2022-09-21 ENCOUNTER — Encounter: Payer: Self-pay | Admitting: Physician Assistant

## 2022-09-21 ENCOUNTER — Ambulatory Visit (INDEPENDENT_AMBULATORY_CARE_PROVIDER_SITE_OTHER): Payer: BC Managed Care – PPO | Admitting: Physician Assistant

## 2022-09-21 VITALS — BP 165/94 | HR 97 | Temp 97.6°F | Resp 20 | Ht 66.0 in | Wt 280.0 lb

## 2022-09-21 DIAGNOSIS — Z8719 Personal history of other diseases of the digestive system: Secondary | ICD-10-CM | POA: Diagnosis not present

## 2022-09-21 DIAGNOSIS — Z9889 Other specified postprocedural states: Secondary | ICD-10-CM

## 2022-09-21 DIAGNOSIS — G35 Multiple sclerosis: Secondary | ICD-10-CM

## 2022-09-21 DIAGNOSIS — R030 Elevated blood-pressure reading, without diagnosis of hypertension: Secondary | ICD-10-CM

## 2022-09-21 DIAGNOSIS — G35D Multiple sclerosis, unspecified: Secondary | ICD-10-CM

## 2022-09-21 DIAGNOSIS — R10812 Left upper quadrant abdominal tenderness: Secondary | ICD-10-CM

## 2022-09-21 DIAGNOSIS — R1013 Epigastric pain: Secondary | ICD-10-CM | POA: Diagnosis not present

## 2022-09-21 MED ORDER — PANTOPRAZOLE SODIUM 40 MG PO TBEC: 40.0000 mg | DELAYED_RELEASE_TABLET | Freq: Every day | ORAL | 2 refills | Status: DC

## 2022-09-21 NOTE — Patient Instructions (Addendum)
Protonix once daily in the morning Ordering CT.  Hiatal Hernia  A hiatal hernia occurs when part of the stomach slides above the muscle that separates the abdomen from the chest (diaphragm). A person can be born with a hiatal hernia (congenital), or it may develop over time. In almost all cases of hiatal hernia, only the top part of the stomach pushes through the diaphragm. Many people have a hiatal hernia with no symptoms. The larger the hernia, the more likely it is that you will have symptoms. In some cases, a hiatal hernia allows stomach acid to flow back into the tube that carries food from your mouth to your stomach (esophagus). This may cause heartburn symptoms. The development of heartburn symptoms may mean that you have a condition called gastroesophageal reflux disease (GERD). What are the causes? This condition is caused by a weakness in the opening (hiatus) where the esophagus passes through the diaphragm to attach to the upper part of the stomach. A person may be born with a weakness in the hiatus, or a weakness can develop over time. What increases the risk? This condition is more likely to develop in: Older people. Age is a major risk factor for a hiatal hernia, especially if you are over the age of 29. Pregnant women. People who are overweight. People who have frequent constipation. What are the signs or symptoms? Symptoms of this condition usually develop in the form of GERD symptoms. Symptoms include: Heartburn. Upset stomach (indigestion). Trouble swallowing. Coughing or wheezing. Wheezing is making high-pitched whistling sounds when you breathe. Sore throat. Chest pain. Nausea and vomiting. How is this diagnosed? This condition may be diagnosed during testing for GERD. Tests that may be done include: X-rays of your stomach or chest. An upper gastrointestinal (GI) series. This is an X-ray exam of your GI tract that is taken after you swallow a chalky liquid that shows up  clearly on the X-ray. Endoscopy. This is a procedure to look into your stomach using a thin, flexible tube that has a tiny camera and light on the end of it. How is this treated? This condition may be treated by: Dietary and lifestyle changes to help reduce GERD symptoms. Medicines. These may include: Over-the-counter antacids. Medicines that make your stomach empty more quickly. Medicines that block the production of stomach acid (H2 blockers). Stronger medicines to reduce stomach acid (proton pump inhibitors). Surgery to repair the hernia, if other treatments are not helping. If you have no symptoms, you may not need treatment. Follow these instructions at home: Lifestyle and activity Do not use any products that contain nicotine or tobacco. These products include cigarettes, chewing tobacco, and vaping devices, such as e-cigarettes. If you need help quitting, ask your health care provider. Try to achieve and maintain a healthy body weight. Avoid putting pressure on your abdomen. Anything that puts pressure on your abdomen increases the amount of acid that may be pushed up into your esophagus. Avoid bending over, especially after eating. Raise the head of your bed by putting blocks under the legs. This keeps your head and esophagus higher than your stomach. Do not wear tight clothing around your chest or stomach. Try not to strain when having a bowel movement, when urinating, or when lifting heavy objects. Eating and drinking Avoid foods that can worsen GERD symptoms. These may include: Fatty foods, like fried foods. Citrus fruits, like oranges or lemon. Other foods and drinks that contain acid, like orange juice or tomatoes. Spicy food. Chocolate. Eat  frequent small meals instead of three large meals a day. This helps prevent your stomach from getting too full. Eat slowly. Do not lie down right after eating. Do not eat 1-2 hours before bed. Do not drink beverages with caffeine.  These include cola, coffee, cocoa, and tea. Do not drink alcohol. General instructions Take over-the-counter and prescription medicines only as told by your health care provider. Keep all follow-up visits. Your health care provider will want to check that any new prescribed medicines are helping your symptoms. Contact a health care provider if: Your symptoms are not controlled with medicines or lifestyle changes. You are having trouble swallowing. You have coughing or wheezing that will not go away. Your pain is getting worse. Your pain spreads to your arms, neck, jaw, teeth, or back. You feel nauseous or you vomit. Get help right away if: You have shortness of breath. You vomit blood. You have bright red blood in your stools. You have black, tarry stools. These symptoms may be an emergency. Get help right away. Call 911. Do not wait to see if the symptoms will go away. Do not drive yourself to the hospital. Summary A hiatal hernia occurs when part of the stomach slides above the muscle that separates the abdomen from the chest. A person may be born with a weakness in the hiatus, or a weakness can develop over time. Symptoms of a hiatal hernia may include heartburn, trouble swallowing, or sore throat. Management of a hiatal hernia includes eating frequent small meals instead of three large meals a day. Get help right away if you vomit blood, have bright red blood in your stools, or have black, tarry stools. This information is not intended to replace advice given to you by your health care provider. Make sure you discuss any questions you have with your health care provider. Document Revised: 09/13/2021 Document Reviewed: 09/13/2021 Elsevier Patient Education  [place].

## 2022-09-21 NOTE — Progress Notes (Signed)
Acute Office Visit  Subjective:     Patient ID: Sandra Hartman, female    DOB: October 05, 1960, 62 y.o.   MRN: [id number]  Chief Complaint  Patient presents with   Follow-up    HPI Patient is in today for epigastric and upper abdominal pain. She has had episodes of this pain for over a year but only last a few minutes. Last episode lasted 12 hours and was "severe". She denies any reflux symptoms. She is more constipation but denies any melena or hematochezia. She does have hx of abdominal hernias and repair in 04/2021. She denies any nausea or vomiting. Foods do not seem to make better or worse. Not effected by activity. Episodes can come when she is sitting or laying down. Denies any heavy lifting. Xrays of ribs and chest were negative for any acute findings. Denies any urinary symptoms. Has seen digestive health for endoscopy a few years ago.  .. Active Ambulatory Problems    Diagnosis Date Noted   MS (multiple sclerosis) ([facility])    White coat syndrome without diagnosis of hypertension 06/30/2013   Anxiety state 09/02/2014   Fluttering muscles 09/02/2014   Squeezing chest pain 09/06/2014   Obesity 10/21/2014   Family history of uterine cancer 12/05/2014   Thickened endometrium 12/05/2014   Bilateral lower abdominal pain 12/05/2014   Epigastric pain 12/05/2014   LUQ abdominal pain 12/05/2014   Colon polyp 12/08/2014   Complex atypical endometrial hyperplasia 04/12/2015   RLQ abdominal pain 06/02/2015   Sacroiliac joint dysfunction of both sides 07/23/2015   BMI 38.0-38.9,adult 04/03/2017   Dyslipidemia (high LDL; low HDL) 04/05/2017   Primary osteoarthritis of left hip 10/30/2017   Post-menopausal 06/24/2018   Vitamin D deficiency 06/24/2018   Ventral hernia without obstruction or gangrene 06/28/2019   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      Objective:    BP (!) 165/94   Pulse 97   Temp 97.6 F (36.4 C) (Oral)   Resp 20   Ht 5' 6"$  (1.676 m)   Wt 280 lb (127 kg)   LMP 09/28/2012 (Approximate)   SpO2 98%   BMI 45.19 kg/m  BP Readings from Last 3 Encounters:  09/21/22 (!) 165/94  07/12/22 (!) 162/94  07/06/21 (!) 164/89   Wt Readings from Last 3 Encounters:  09/21/22 280 lb (127 kg)  07/12/22 275 lb (124.7 kg)  07/06/21 250 lb (113.4 kg)      Physical Exam Constitutional:      Appearance: Normal appearance. She is obese.  HENT:     Head: Normocephalic.  Neck:     Vascular: No carotid bruit.  Cardiovascular:     Rate and Rhythm: Normal rate and regular rhythm.  Pulmonary:     Effort: Pulmonary effort is normal.     Breath sounds: Normal breath sounds.  Abdominal:     General: Bowel sounds are normal. There is no distension.  Palpations: Abdomen is soft. There is no mass.     Tenderness: There is abdominal tenderness. There is no right CVA tenderness, left CVA tenderness, guarding or rebound.     Hernia: No hernia is present.     Comments: No rebound or guarding but generalized upper abdominal tenderness to palpation.  Well healed abdominal scars from prior hernia repair No active hernias seen or palpated  Musculoskeletal:     Cervical back: Normal range of motion and neck supple.  Lymphadenopathy:     Cervical: No cervical adenopathy.  Neurological:     Mental Status: She is alert.          Assessment & Plan:  Marland KitchenMarland KitchenAnnice was seen today for follow-up.  Diagnoses and all orders for  this visit:  Epigastric pain -     COMPLETE METABOLIC PANEL WITH GFR -     Lipase -     CBC w/Diff/Platelet -     CT ABDOMEN PELVIS W CONTRAST; Future -     pantoprazole (PROTONIX) 40 MG tablet; Take 1 tablet (40 mg total) by mouth daily.  History of hernia repair -     COMPLETE METABOLIC PANEL WITH GFR -     Lipase -     CBC w/Diff/Platelet -     CT ABDOMEN PELVIS W CONTRAST; Future  MS (multiple sclerosis) ([facility])  White coat syndrome without diagnosis of hypertension  Left upper quadrant abdominal tenderness without rebound tenderness -     pantoprazole (PROTONIX) 40 MG tablet; Take 1 tablet (40 mg total) by mouth daily.   Unclear etiology symptoms sound like hiatal hernia Cmp, cbc, lipase ordered today CT of abdomen ordered today to look for hernias, infection, bile duct dilation  Start protonix daily  Follow up in 4 weeks or sooner if symptoms worsen  BP elevated in office Continue to monitor BP at home  [facility], PA-C

## 2022-09-22 ENCOUNTER — Other Ambulatory Visit: Payer: Self-pay | Admitting: Neurology

## 2022-09-22 DIAGNOSIS — R748 Abnormal levels of other serum enzymes: Secondary | ICD-10-CM

## 2022-09-22 LAB — COMPLETE METABOLIC PANEL WITH GFR
AG Ratio: 1.6 (calc) (ref 1.0–2.5)
ALT: 104 U/L — ABNORMAL HIGH (ref 6–29)
AST: 15 U/L (ref 10–35)
Albumin: 4.4 g/dL (ref 3.6–5.1)
Alkaline phosphatase (APISO): 120 U/L (ref 37–153)
BUN: 14 mg/dL (ref 7–25)
CO2: 26 mmol/L (ref 20–32)
Calcium: 10.1 mg/dL (ref 8.6–10.4)
Chloride: 104 mmol/L (ref 98–110)
Creat: 0.86 mg/dL (ref 0.50–1.05)
Globulin: 2.8 g/dL (calc) (ref 1.9–3.7)
Glucose, Bld: 92 mg/dL (ref 65–99)
Potassium: 4.8 mmol/L (ref 3.5–5.3)
Sodium: 142 mmol/L (ref 135–146)
Total Bilirubin: 0.6 mg/dL (ref 0.2–1.2)
Total Protein: 7.2 g/dL (ref 6.1–8.1)
eGFR: 76 mL/min/{1.73_m2} (ref >=60)

## 2022-09-22 LAB — LIPASE: Lipase: 26 U/L (ref 7–60)

## 2022-09-22 LAB — CBC WITH DIFFERENTIAL/PLATELET
Absolute Monocytes: 466 cells/uL (ref 200–950)
Basophils Absolute: 70 cells/uL (ref 0–200)
Basophils Relative: 0.8 %
Eosinophils Absolute: 97 cells/uL (ref 15–500)
Eosinophils Relative: 1.1 %
HCT: 44.3 % (ref 35.0–45.0)
Hemoglobin: 15.2 g/dL (ref 11.7–15.5)
Lymphs Abs: 2050 cells/uL (ref 850–3900)
MCH: 32.8 pg (ref 27.0–33.0)
MCHC: 34.3 g/dL (ref 32.0–36.0)
MCV: 95.5 fL (ref 80.0–100.0)
MPV: 10.7 fL (ref 7.5–12.5)
Monocytes Relative: 5.3 %
Neutro Abs: 6116 cells/uL (ref 1500–7800)
Neutrophils Relative %: 69.5 %
Platelets: 325 10*3/uL (ref 140–400)
RBC: 4.64 10*6/uL (ref 3.80–5.10)
RDW: 12.2 % (ref 11.0–15.0)
Total Lymphocyte: 23.3 %
WBC: 8.8 10*3/uL (ref 3.8–10.8)

## 2022-09-22 NOTE — Progress Notes (Signed)
WBC and hemoglobin in normal limits.  Lipase is normal.  Kidney and glucose and electrolytes look good.  ALT is up quite a bit from 2 months ago. Certainly appropriate to get CT. We need to make sure this number does not continue to trend up. Recheck in 2 weeks.

## 2022-09-28 ENCOUNTER — Encounter: Payer: Self-pay | Admitting: Physician Assistant

## 2022-10-02 ENCOUNTER — Ambulatory Visit (INDEPENDENT_AMBULATORY_CARE_PROVIDER_SITE_OTHER): Payer: BC Managed Care – PPO

## 2022-10-02 DIAGNOSIS — Z8719 Personal history of other diseases of the digestive system: Secondary | ICD-10-CM

## 2022-10-02 DIAGNOSIS — R1013 Epigastric pain: Secondary | ICD-10-CM

## 2022-10-02 DIAGNOSIS — I700 Atherosclerosis of aorta: Secondary | ICD-10-CM | POA: Diagnosis not present

## 2022-10-02 DIAGNOSIS — Z9889 Other specified postprocedural states: Secondary | ICD-10-CM

## 2022-10-02 DIAGNOSIS — R109 Unspecified abdominal pain: Secondary | ICD-10-CM | POA: Diagnosis not present

## 2022-10-02 MED ORDER — IOHEXOL 300 MG/ML  SOLN
100.0000 mL | Freq: Once | INTRAMUSCULAR | Status: AC | PRN
  Administered 2022-10-02: 100 mL via INTRAVENOUS

## 2022-10-03 ENCOUNTER — Encounter: Payer: Self-pay | Admitting: Physician Assistant

## 2022-10-03 DIAGNOSIS — I2510 Atherosclerotic heart disease of native coronary artery without angina pectoris: Secondary | ICD-10-CM | POA: Insufficient documentation

## 2022-10-03 DIAGNOSIS — K760 Fatty (change of) liver, not elsewhere classified: Secondary | ICD-10-CM | POA: Insufficient documentation

## 2022-10-03 DIAGNOSIS — I700 Atherosclerosis of aorta: Secondary | ICD-10-CM | POA: Insufficient documentation

## 2022-10-03 NOTE — Progress Notes (Signed)
CT does show fatty liver which can cause some mild elevation of liver enzymes. Working on diet and weight loss can improve fatty liver. Avoid tylenol and alcohol can also help.   There does appear to be some coronary calcification of LAD. We need to evaluate this with stress test. I would also strongly suggest starting a statin and ASA daily to decrease CV risk.   Marland Kitchen.The 10-year ASCVD risk score (Arnett DK, et al., 2019) is: 8%   Values used to calculate the score:     Age: 62 years     Sex: Female     Is Non-Hispanic African American: No     Diabetic: No     Tobacco smoker: No     Systolic Blood Pressure: 123XX123 mmHg     Is BP treated: No     HDL Cholesterol: 55 mg/dL     Total Cholesterol: 270 mg/dL

## 2022-10-04 ENCOUNTER — Encounter: Payer: Self-pay | Admitting: Neurology

## 2022-10-04 DIAGNOSIS — I2510 Atherosclerotic heart disease of native coronary artery without angina pectoris: Secondary | ICD-10-CM

## 2022-10-04 NOTE — Addendum Note (Signed)
Addended by: Donella Stade on: 10/04/2022 01:09 PM   Modules accepted: Orders

## 2022-10-04 NOTE — Telephone Encounter (Signed)
FYI on Statin..  I am not sure about the stress test question. Do you know?

## 2022-10-05 NOTE — Addendum Note (Signed)
Addended byAnnamaria Helling on: 10/05/2022 09:24 AM   Modules accepted: Orders

## 2022-10-05 NOTE — Telephone Encounter (Signed)
Referral placed.

## 2022-10-25 DIAGNOSIS — K439 Ventral hernia without obstruction or gangrene: Secondary | ICD-10-CM | POA: Diagnosis not present

## 2022-10-25 DIAGNOSIS — R0789 Other chest pain: Secondary | ICD-10-CM | POA: Diagnosis not present

## 2022-10-25 DIAGNOSIS — K432 Incisional hernia without obstruction or gangrene: Secondary | ICD-10-CM | POA: Diagnosis not present

## 2022-10-25 DIAGNOSIS — Z1330 Encounter for screening examination for mental health and behavioral disorders, unspecified: Secondary | ICD-10-CM | POA: Diagnosis not present

## 2022-11-21 DIAGNOSIS — R0789 Other chest pain: Secondary | ICD-10-CM | POA: Diagnosis not present

## 2022-11-28 DIAGNOSIS — I2510 Atherosclerotic heart disease of native coronary artery without angina pectoris: Secondary | ICD-10-CM | POA: Diagnosis not present

## 2022-11-28 DIAGNOSIS — E782 Mixed hyperlipidemia: Secondary | ICD-10-CM | POA: Diagnosis not present

## 2022-12-04 ENCOUNTER — Ambulatory Visit: Payer: BC Managed Care – PPO | Admitting: Cardiology

## 2022-12-06 ENCOUNTER — Encounter: Payer: Self-pay | Admitting: Sports Medicine

## 2022-12-18 DIAGNOSIS — E782 Mixed hyperlipidemia: Secondary | ICD-10-CM | POA: Diagnosis not present

## 2022-12-18 DIAGNOSIS — I2510 Atherosclerotic heart disease of native coronary artery without angina pectoris: Secondary | ICD-10-CM | POA: Diagnosis not present

## 2022-12-19 ENCOUNTER — Other Ambulatory Visit (INDEPENDENT_AMBULATORY_CARE_PROVIDER_SITE_OTHER): Payer: BC Managed Care – PPO

## 2022-12-19 ENCOUNTER — Ambulatory Visit (INDEPENDENT_AMBULATORY_CARE_PROVIDER_SITE_OTHER): Payer: BC Managed Care – PPO | Admitting: Sports Medicine

## 2022-12-19 DIAGNOSIS — M533 Sacrococcygeal disorders, not elsewhere classified: Secondary | ICD-10-CM

## 2022-12-19 DIAGNOSIS — M1612 Unilateral primary osteoarthritis, left hip: Secondary | ICD-10-CM

## 2022-12-19 NOTE — Assessment & Plan Note (Signed)
Pleasant 62 year old female, known hip osteoarthritis, had a hip joint injection back in February of this year, now with recurrence of pain, pain localized in the groin, repeat left hip joint injection. For the next injection I would recommend we use a 7 in spinal needle rather than the 3.5 in we used today.

## 2022-12-19 NOTE — Assessment & Plan Note (Signed)
Known SI joint pain, last SI joint injection was in December 2022 bilateral. Now with recurrence of pain, today we did a left SI joint injection, return to see me as needed.

## 2022-12-19 NOTE — Progress Notes (Signed)
    Procedures performed today:    Procedure: Real-time Ultrasound Guided injection of the left hip joint Device: Samsung HS60  Verbal informed consent obtained.  Time-out conducted.  Noted no overlying erythema, induration, or other signs of local infection.  Skin prepped in a sterile fashion.  Local anesthesia: Topical Ethyl chloride.  With sterile technique and under real time ultrasound guidance: Arthritic joint noted, 1 cc Kenalog 40, 2 cc lidocaine, 2 cc bupivacaine injected easily Completed without difficulty  Advised to call if fevers/chills, erythema, induration, drainage, or persistent bleeding.  Images permanently stored and available for review in PACS.  Impression: Technically successful ultrasound guided injection.  Procedure: Real-time Ultrasound Guided injection of the left sacroiliac joint Device: Samsung HS60  Verbal informed consent obtained.  Time-out conducted.  Noted no overlying erythema, induration, or other signs of local infection.  Skin prepped in a sterile fashion.  Local anesthesia: Topical Ethyl chloride.  With sterile technique and under real time ultrasound guidance: Noted minimal degenerative changes, 1 cc Kenalog 40, 2 cc lidocaine, 2 cc bupivacaine injected easily Completed without difficulty  Advised to call if fevers/chills, erythema, induration, drainage, or persistent bleeding.  Images permanently stored and available for review in PACS.  Impression: Technically successful ultrasound guided injection.  Independent interpretation of notes and tests performed by another provider:   None.  Brief History, Exam, Impression, and Recommendations:    Primary osteoarthritis of left hip Pleasant 62 year old female, known hip osteoarthritis, had a hip joint injection back in February of this year, now with recurrence of pain, pain localized in the groin, repeat left hip joint injection. For the next injection I would recommend we use a 7 in spinal  needle rather than the 3.5 in we used today.  Sacroiliac joint dysfunction of both sides Known SI joint pain, last SI joint injection was in December 2022 bilateral. Now with recurrence of pain, today we did a left SI joint injection, return to see me as needed.    ____________________________________________ Ihor Austin. Benjamin Stain, M.D., ABFM., CAQSM., AME. Primary Care and Sports Medicine [facility]  Adjunct Professor of Family Medicine  [place]  Restaurant manager, fast food

## 2023-01-23 ENCOUNTER — Encounter: Payer: Self-pay | Admitting: Physician Assistant

## 2023-01-23 ENCOUNTER — Ambulatory Visit (INDEPENDENT_AMBULATORY_CARE_PROVIDER_SITE_OTHER): Payer: BC Managed Care – PPO | Admitting: Physician Assistant

## 2023-01-23 VITALS — BP 161/94 | HR 88 | Ht 66.0 in | Wt 275.0 lb

## 2023-01-23 DIAGNOSIS — M25512 Pain in left shoulder: Secondary | ICD-10-CM | POA: Diagnosis not present

## 2023-01-23 DIAGNOSIS — G8929 Other chronic pain: Secondary | ICD-10-CM

## 2023-01-23 DIAGNOSIS — R030 Elevated blood-pressure reading, without diagnosis of hypertension: Secondary | ICD-10-CM

## 2023-01-23 DIAGNOSIS — R1013 Epigastric pain: Secondary | ICD-10-CM | POA: Diagnosis not present

## 2023-01-23 DIAGNOSIS — M47816 Spondylosis without myelopathy or radiculopathy, lumbar region: Secondary | ICD-10-CM

## 2023-01-23 DIAGNOSIS — M545 Low back pain, unspecified: Secondary | ICD-10-CM | POA: Insufficient documentation

## 2023-01-23 MED ORDER — HYDROCODONE-ACETAMINOPHEN 5-325 MG PO TABS: 1.0000 | ORAL_TABLET | Freq: Four times a day (QID) | ORAL | 0 refills | Status: AC | PRN

## 2023-01-23 MED ORDER — TRAMADOL HCL 50 MG PO TABS
50.0000 mg | ORAL_TABLET | Freq: Four times a day (QID) | ORAL | 0 refills | Status: DC | PRN
Start: 2023-01-23 — End: 2023-01-23

## 2023-01-23 MED ORDER — DEXLANSOPRAZOLE 60 MG PO CPDR
60.0000 mg | DELAYED_RELEASE_CAPSULE | Freq: Every day | ORAL | 1 refills | Status: DC
Start: 2023-01-23 — End: 2023-06-26

## 2023-01-23 NOTE — Progress Notes (Signed)
Established Patient Office Visit  Subjective   Patient ID: Sandra Hartman, female    DOB: 01/17/1961  Age: 62 y.o. MRN: [id number]  Chief Complaint  Patient presents with   Pain Management    Pt states she wants to start a pain management program    HPI Pt is a 62 yo obese femalemale with MS, chronic pain due to lumbar spondylosis who presents to the clinic for follow up.   She is taking diclofenac for pain and needing something for break through pain. She sees Dr. Karie Schwalbe and had injections from time to time. Tramadol does not help with pain.   For last 2 weeks epigastric pain has been much worse. She was seen back in spring and CT was done with no acute findings. This weekend she could not even eat a banana without vomiting and having epigastric pain. Denies any melena or hematochezia. She is more constipated but not really eating so nothing to come out. Protonix did not help. Had colonoscopy back in 2016.    Active Ambulatory Problems    Diagnosis Date Noted   MS (multiple sclerosis) ([facility])    White coat syndrome without diagnosis of hypertension 06/30/2013   Anxiety state 09/02/2014   Fluttering muscles 09/02/2014   Squeezing chest pain 09/06/2014   Obesity 10/21/2014   Family history of uterine cancer 12/05/2014   Thickened endometrium 12/05/2014   Bilateral lower abdominal pain 12/05/2014   Epigastric pain 12/05/2014   LUQ abdominal pain 12/05/2014   Colon polyp 12/08/2014   Complex atypical endometrial hyperplasia 04/12/2015   RLQ abdominal pain 06/02/2015   Sacroiliac joint dysfunction of both sides 07/23/2015   BMI 38.0-38.9,adult 04/03/2017   Dyslipidemia (high LDL; low HDL) 04/05/2017   Primary osteoarthritis of left hip 10/30/2017   Post-menopausal 06/24/2018   Vitamin D deficiency 06/24/2018   Ventral hernia without obstruction or gangrene 06/28/2019   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 06/28/2019   Herniation of intervertebral disc of thoracic  region 05/24/2020   Lumbar spondylosis 06/08/2020   Left shoulder pain 07/12/2021   Neuropathy of both feet 07/17/2022   History of hernia repair 09/21/2022   Left upper quadrant abdominal tenderness without rebound tenderness 09/21/2022   Hepatic steatosis 10/03/2022   Aortic atherosclerosis ([facility]) 10/03/2022   CAD in native artery 10/03/2022   Chronic bilateral low back pain without sciatica 01/23/2023   Resolved Ambulatory Problems    Diagnosis Date Noted   Sacroiliac joint dysfunction of left side 06/19/2016   Past Medical History:  Diagnosis Date   Degenerative joint disease (DJD) of hip 10/30/2017     ROS See HPI.    Objective:     BP (!) 161/94 (BP Location: Left Arm, Patient Position: Sitting, Cuff Size: Large)   Pulse 88   Ht 5\' 6"  (1.676 m)   Wt 275 lb (124.7 kg)   LMP 09/28/2012 (Approximate)   SpO2 97%   BMI 44.39 kg/m  BP Readings from Last 3 Encounters:  01/23/23 (!) 161/94  09/21/22 (!) 165/94  07/12/22 (!) 162/94   Wt Readings from Last 3 Encounters:  01/23/23 275 lb (124.7 kg)  09/21/22 280 lb (127 kg)  07/12/22 275 lb (124.7 kg)      Physical Exam Constitutional:      Appearance: Normal appearance. She is obese.  HENT:     Head: Normocephalic.  Cardiovascular:     Rate and Rhythm: Normal rate and regular rhythm.  Pulmonary:  Effort: Pulmonary effort is normal.  Abdominal:     General: Bowel sounds are normal. There is no distension.     Palpations: Abdomen is soft. There is no mass.     Tenderness: There is abdominal tenderness. There is no right CVA tenderness, left CVA tenderness, guarding or rebound.     Hernia: A hernia is present.     Comments: Ventral hernia noted, reducible.  Tenderness over epigastric area to palpation. No guarding or rebound.   Neurological:     General: No focal deficit present.     Mental Status: She is alert and oriented to person, place, and time.  Psychiatric:        Mood and Affect: Mood normal.        The 10-year ASCVD risk score (Arnett DK, et al., 2019) is: 8.4%    Assessment & Plan:  Marland KitchenMarland KitchenAlfie was seen today for pain management.  Diagnoses and all orders for this visit:  Epigastric pain -     dexlansoprazole (DEXILANT) 60 MG capsule; Take 1 capsule (60 mg total) by mouth daily. -     Ambulatory referral to Gastroenterology  Chronic left shoulder pain -     Discontinue: traMADol (ULTRAM) 50 MG tablet; Take 1 tablet (50 mg total) by mouth every 6 (six) hours as needed for moderate pain. Maximum 4 tabs per day. -     HYDROcodone-acetaminophen (NORCO/VICODIN) 5-325 MG tablet; Take 1 tablet by mouth every 6 (six) hours as needed for up to 5 days for moderate pain.  White coat syndrome without diagnosis of hypertension  Lumbar spondylosis -     HYDROcodone-acetaminophen (NORCO/VICODIN) 5-325 MG tablet; Take 1 tablet by mouth every 6 (six) hours as needed for up to 5 days for moderate pain.  Chronic bilateral low back pain without sciatica -     HYDROcodone-acetaminophen (NORCO/VICODIN) 5-325 MG tablet; Take 1 tablet by mouth every 6 (six) hours as needed for up to 5 days for moderate pain.   Symptoms sound like hiatal hernia Reviewed CT for 09/2022 no hernia seen Referral to GI  Start dexilant HO given for hiatal hernia and small frequent meals  BP elevated in office-home BP 120-130s over 70s-80s.  Discussed pain management Right now just use hydrocodone for breakthrough pain only Warned of making constipation worse .Marland KitchenPDMP reviewed during this encounter. No concerns Follow up in 1-2 months to see how frequently taking and how many she is needing monthly.  Will fill out pain contract in the future.  Continue to follow up with Dr. Karie Schwalbe for arthritis management   Return in about 2 months (around 03/25/2023).    Tandy Gaw, PA-C

## 2023-01-23 NOTE — Patient Instructions (Addendum)
Referral to GI. Start dexilant. Tramadol for break through pain.   Hiatal Hernia  A hiatal hernia occurs when part of the stomach slides above the muscle that separates the abdomen from the chest (diaphragm). A person can be born with a hiatal hernia (congenital), or it may develop over time. In almost all cases of hiatal hernia, only the top part of the stomach pushes through the diaphragm. Many people have a hiatal hernia with no symptoms. The larger the hernia, the more likely it is that you will have symptoms. In some cases, a hiatal hernia allows stomach acid to flow back into the tube that carries food from your mouth to your stomach (esophagus). This may cause heartburn symptoms. The development of heartburn symptoms may mean that you have a condition called gastroesophageal reflux disease (GERD). What are the causes? This condition is caused by a weakness in the opening (hiatus) where the esophagus passes through the diaphragm to attach to the upper part of the stomach. A person may be born with a weakness in the hiatus, or a weakness can develop over time. What increases the risk? This condition is more likely to develop in: Older people. Age is a major risk factor for a hiatal hernia, especially if you are over the age of 48. Pregnant women. People who are overweight. People who have frequent constipation. What are the signs or symptoms? Symptoms of this condition usually develop in the form of GERD symptoms. Symptoms include: Heartburn. Upset stomach (indigestion). Trouble swallowing. Coughing or wheezing. Wheezing is making high-pitched whistling sounds when you breathe. Sore throat. Chest pain. Nausea and vomiting. How is this diagnosed? This condition may be diagnosed during testing for GERD. Tests that may be done include: X-rays of your stomach or chest. An upper gastrointestinal (GI) series. This is an X-ray exam of your GI tract that is taken after you swallow a chalky  liquid that shows up clearly on the X-ray. Endoscopy. This is a procedure to look into your stomach using a thin, flexible tube that has a tiny camera and light on the end of it. How is this treated? This condition may be treated by: Dietary and lifestyle changes to help reduce GERD symptoms. Medicines. These may include: Over-the-counter antacids. Medicines that make your stomach empty more quickly. Medicines that block the production of stomach acid (H2 blockers). Stronger medicines to reduce stomach acid (proton pump inhibitors). Surgery to repair the hernia, if other treatments are not helping. If you have no symptoms, you may not need treatment. Follow these instructions at home: Lifestyle and activity Do not use any products that contain nicotine or tobacco. These products include cigarettes, chewing tobacco, and vaping devices, such as e-cigarettes. If you need help quitting, ask your health care provider. Try to achieve and maintain a healthy body weight. Avoid putting pressure on your abdomen. Anything that puts pressure on your abdomen increases the amount of acid that may be pushed up into your esophagus. Avoid bending over, especially after eating. Raise the head of your bed by putting blocks under the legs. This keeps your head and esophagus higher than your stomach. Do not wear tight clothing around your chest or stomach. Try not to strain when having a bowel movement, when urinating, or when lifting heavy objects. Eating and drinking Avoid foods that can worsen GERD symptoms. These may include: Fatty foods, like fried foods. Citrus fruits, like oranges or lemon. Other foods and drinks that contain acid, like orange juice or tomatoes. Spicy  food. Chocolate. Eat frequent small meals instead of three large meals a day. This helps prevent your stomach from getting too full. Eat slowly. Do not lie down right after eating. Do not eat 1-2 hours before bed. Do not drink  beverages with caffeine. These include cola, coffee, cocoa, and tea. Do not drink alcohol. General instructions Take over-the-counter and prescription medicines only as told by your health care provider. Keep all follow-up visits. Your health care provider will want to check that any new prescribed medicines are helping your symptoms. Contact a health care provider if: Your symptoms are not controlled with medicines or lifestyle changes. You are having trouble swallowing. You have coughing or wheezing that will not go away. Your pain is getting worse. Your pain spreads to your arms, neck, jaw, teeth, or back. You feel nauseous or you vomit. Get help right away if: You have shortness of breath. You vomit blood. You have bright red blood in your stools. You have black, tarry stools. These symptoms may be an emergency. Get help right away. Call 911. Do not wait to see if the symptoms will go away. Do not drive yourself to the hospital. Summary A hiatal hernia occurs when part of the stomach slides above the muscle that separates the abdomen from the chest. A person may be born with a weakness in the hiatus, or a weakness can develop over time. Symptoms of a hiatal hernia may include heartburn, trouble swallowing, or sore throat. Management of a hiatal hernia includes eating frequent small meals instead of three large meals a day. Get help right away if you vomit blood, have bright red blood in your stools, or have black, tarry stools. This information is not intended to replace advice given to you by your health care provider. Make sure you discuss any questions you have with your health care provider. Document Revised: 09/13/2021 Document Reviewed: 09/13/2021 Elsevier Patient Education  2024 [employer].

## 2023-01-26 ENCOUNTER — Telehealth: Payer: Self-pay

## 2023-01-26 NOTE — Telephone Encounter (Signed)
Initiated Prior authorization NFA:[id number] 60MG  dr capsules Via: Covermymeds Case/Key:[id number] Status: Pending as of 01/26/23 Reason:Drug is covered by current benefit plan. No further PA activity needed  Notified Pt via: Mychart

## 2023-02-08 DIAGNOSIS — R140 Abdominal distension (gaseous): Secondary | ICD-10-CM | POA: Diagnosis not present

## 2023-02-08 DIAGNOSIS — Z86010 Personal history of colonic polyps: Secondary | ICD-10-CM | POA: Diagnosis not present

## 2023-02-08 DIAGNOSIS — K5900 Constipation, unspecified: Secondary | ICD-10-CM | POA: Diagnosis not present

## 2023-02-08 DIAGNOSIS — R1013 Epigastric pain: Secondary | ICD-10-CM | POA: Diagnosis not present

## 2023-02-16 ENCOUNTER — Encounter: Payer: Self-pay | Admitting: Medical-Surgical

## 2023-02-16 ENCOUNTER — Ambulatory Visit (INDEPENDENT_AMBULATORY_CARE_PROVIDER_SITE_OTHER): Payer: BC Managed Care – PPO | Admitting: Medical-Surgical

## 2023-02-16 ENCOUNTER — Ambulatory Visit (INDEPENDENT_AMBULATORY_CARE_PROVIDER_SITE_OTHER): Payer: BC Managed Care – PPO

## 2023-02-16 VITALS — BP 168/105 | HR 108 | Resp 20 | Ht 66.0 in | Wt 275.0 lb

## 2023-02-16 DIAGNOSIS — S4991XA Unspecified injury of right shoulder and upper arm, initial encounter: Secondary | ICD-10-CM

## 2023-02-16 MED ORDER — HYDROCODONE-ACETAMINOPHEN 5-325 MG PO TABS: 1.0000 | ORAL_TABLET | Freq: Four times a day (QID) | ORAL | 0 refills | Status: DC | PRN

## 2023-02-16 NOTE — Progress Notes (Signed)
        Established patient visit  History, exam, impression, and plan:  1. Arm injury, right, initial encounter Pleasant 62 year old female presenting today for evaluation of right arm pain after a fall outside on Tuesday.  Notes that her sister passed away on 24-Mar-2023 and she went to the funeral home on Tuesday to make arrangements.  On her way out, she tripped in the parking lot and fell onto the ground.  She has multiple scrapes on her left forearm, left knee, and right knee.  She does not remember hitting her arm on anything however she has a very large reddish-purple bruise to the inner bicep on the right arm.  Reports that the right forearm and hand are fine and she is able to move the shoulder up and down.  Her biggest concern is that she has found herself unable to abduct the right arm at the shoulder level due to pain over the deltoid and entire upper arm.  On exam, no significant tenderness to palpation along the lower humerus, scapula, and clavicle.  Some tenderness over the deltoid and shoulder joint noted.  Unable to actively move the upper arm for flexion, extension, abduction and adduction due to severe pain.  Passive range of motion noted to the right shoulder joint without pain..  Grips equal bilaterally.  Neurovascularly intact.  Plan for right upper extremity x-rays today.  Has been using Norco that she had at home for pain management but is almost out of her prescription.  Refilling today. - DG Shoulder Right; Future  Update: Right shoulder x-rays showing mildly displaced fracture of the greater tuberosity of the right humerus.  Continue with pain management as noted above.  Recommend immobilization with a sling however she is unable to tolerate the neck strap on the sling that was provided.  Reviewed alternatives to wearing a sling including using her shirt and keeping her right arm inside the arm hole against her abdomen/chest or using a soft material/scarf to fashion a homemade sling  if desired.  Plan to return in 1 week to follow-up with Dr. Karie Schwalbe for further evaluation/recommendation.  Procedures performed this visit: None.  Return in about 1 week (around 02/23/2023) for Right humerus fracture follow-up with Dr. Karie Schwalbe.  __________________________________ Thayer Ohm, DNP, APRN, FNP-BC Primary Care and Sports Medicine [facility] [place]

## 2023-02-21 ENCOUNTER — Encounter: Payer: Self-pay | Admitting: Sports Medicine

## 2023-02-21 DIAGNOSIS — Z1231 Encounter for screening mammogram for malignant neoplasm of breast: Secondary | ICD-10-CM

## 2023-02-23 ENCOUNTER — Ambulatory Visit (INDEPENDENT_AMBULATORY_CARE_PROVIDER_SITE_OTHER): Payer: BC Managed Care – PPO | Admitting: Sports Medicine

## 2023-02-23 ENCOUNTER — Ambulatory Visit (INDEPENDENT_AMBULATORY_CARE_PROVIDER_SITE_OTHER): Payer: BC Managed Care – PPO

## 2023-02-23 DIAGNOSIS — S42254A Nondisplaced fracture of greater tuberosity of right humerus, initial encounter for closed fracture: Secondary | ICD-10-CM | POA: Diagnosis not present

## 2023-02-23 DIAGNOSIS — R600 Localized edema: Secondary | ICD-10-CM

## 2023-02-23 DIAGNOSIS — M7989 Other specified soft tissue disorders: Secondary | ICD-10-CM | POA: Diagnosis not present

## 2023-02-23 DIAGNOSIS — S42291A Other displaced fracture of upper end of right humerus, initial encounter for closed fracture: Secondary | ICD-10-CM | POA: Diagnosis not present

## 2023-02-23 DIAGNOSIS — S42251A Displaced fracture of greater tuberosity of right humerus, initial encounter for closed fracture: Secondary | ICD-10-CM | POA: Insufficient documentation

## 2023-02-23 DIAGNOSIS — M19011 Primary osteoarthritis, right shoulder: Secondary | ICD-10-CM | POA: Diagnosis not present

## 2023-02-23 MED ORDER — OXYCODONE-ACETAMINOPHEN 10-325 MG PO TABS
1.0000 | ORAL_TABLET | Freq: Three times a day (TID) | ORAL | 0 refills | Status: DC | PRN
Start: 2023-02-23 — End: 2023-03-19

## 2023-02-23 NOTE — Progress Notes (Signed)
    Procedures performed today:    None.  Independent interpretation of notes and tests performed by another provider:   None.  Brief History, Exam, Impression, and Recommendations:    Fracture of greater tuberosity of right humerus Recent fall fracture greater tuberosity. Increasing high-dose Percocet, she was unable to tolerate the sling so we will switch to a cuff and collar. I would like updated x-rays, return to see me in 2 to 3 weeks.  Left leg swelling Recent fall, muscle bruising left lower leg, otherwise good motion and good strength of the knee and ankle, she does have some tenderness in the calf with a minimally positive Homans' sign and a palpable tender nodule along the course of the greater saphenous vein. I would like a DVT ultrasound today.    ____________________________________________ Ihor Austin. Benjamin Stain, M.D., ABFM., CAQSM., AME. Primary Care and Sports Medicine [facility]  Adjunct Professor of Family Medicine  [place] [employer] [facility] of Medicine  Restaurant manager, fast food

## 2023-02-23 NOTE — Assessment & Plan Note (Signed)
Recent fall, muscle bruising left lower leg, otherwise good motion and good strength of the knee and ankle, she does have some tenderness in the calf with a minimally positive Homans' sign and a palpable tender nodule along the course of the greater saphenous vein. I would like a DVT ultrasound today.

## 2023-02-23 NOTE — Assessment & Plan Note (Signed)
Recent fall fracture greater tuberosity. Increasing high-dose Percocet, she was unable to tolerate the sling so we will switch to a cuff and collar. I would like updated x-rays, return to see me in 2 to 3 weeks.

## 2023-03-19 ENCOUNTER — Ambulatory Visit (INDEPENDENT_AMBULATORY_CARE_PROVIDER_SITE_OTHER): Payer: BC Managed Care – PPO | Admitting: Sports Medicine

## 2023-03-19 ENCOUNTER — Ambulatory Visit (INDEPENDENT_AMBULATORY_CARE_PROVIDER_SITE_OTHER): Payer: BC Managed Care – PPO

## 2023-03-19 DIAGNOSIS — S42254D Nondisplaced fracture of greater tuberosity of right humerus, subsequent encounter for fracture with routine healing: Secondary | ICD-10-CM

## 2023-03-19 DIAGNOSIS — S42254A Nondisplaced fracture of greater tuberosity of right humerus, initial encounter for closed fracture: Secondary | ICD-10-CM

## 2023-03-19 DIAGNOSIS — M1612 Unilateral primary osteoarthritis, left hip: Secondary | ICD-10-CM | POA: Diagnosis not present

## 2023-03-19 DIAGNOSIS — M25711 Osteophyte, right shoulder: Secondary | ICD-10-CM | POA: Diagnosis not present

## 2023-03-19 DIAGNOSIS — M19011 Primary osteoarthritis, right shoulder: Secondary | ICD-10-CM | POA: Diagnosis not present

## 2023-03-19 DIAGNOSIS — S42201D Unspecified fracture of upper end of right humerus, subsequent encounter for fracture with routine healing: Secondary | ICD-10-CM | POA: Diagnosis not present

## 2023-03-19 MED ORDER — OXYCODONE-ACETAMINOPHEN 10-325 MG PO TABS
1.0000 | ORAL_TABLET | Freq: Three times a day (TID) | ORAL | 0 refills | Status: DC | PRN
Start: 2023-03-19 — End: 2023-03-29

## 2023-03-19 MED ORDER — CALCIUM 600+D PLUS MINERALS 600-400 MG-UNIT PO TABS
ORAL_TABLET | ORAL | Status: AC
Start: 2023-03-19 — End: ?

## 2023-03-19 NOTE — Assessment & Plan Note (Signed)
Severe hip osteoarthritis, last injection was May 21, she can come back later this or early next week for repeat hip injection. We will need to use a 7 inch spinal needle for the next injection. Refilling Percocet.

## 2023-03-19 NOTE — Progress Notes (Signed)
    Procedures performed today:    None.  Independent interpretation of notes and tests performed by another provider:   None.  Brief History, Exam, Impression, and Recommendations:    Primary osteoarthritis of left hip Severe hip osteoarthritis, last injection was May 21, she can come back later this or early next week for repeat hip injection. We will need to use a 7 inch spinal needle for the next injection. Refilling Percocet.  Fracture of greater tuberosity of right humerus Approximately 1 month status post fall with greater tuberosity fracture right humerus. She did not tolerate the sling so we switched to a cuff and collar and she is doing well, we will get updated x-rays and if they do show sufficient healing we will have her start her home conditioning.    ____________________________________________ Ihor Austin. Benjamin Stain, M.D., ABFM., CAQSM., AME. Primary Care and Sports Medicine [facility]  Adjunct Professor of Family Medicine  [place] of [facility] of Medicine  Restaurant manager, fast food

## 2023-03-19 NOTE — Assessment & Plan Note (Signed)
Approximately 1 month status post fall with greater tuberosity fracture right humerus. She did not tolerate the sling so we switched to a cuff and collar and she is doing well, we will get updated x-rays and if they do show sufficient healing we will have her start her home conditioning.

## 2023-03-21 ENCOUNTER — Other Ambulatory Visit: Payer: Self-pay | Admitting: Physician Assistant

## 2023-03-21 DIAGNOSIS — Z1231 Encounter for screening mammogram for malignant neoplasm of breast: Secondary | ICD-10-CM

## 2023-03-22 ENCOUNTER — Encounter: Payer: Self-pay | Admitting: Sports Medicine

## 2023-03-28 ENCOUNTER — Encounter: Payer: Self-pay | Admitting: Sports Medicine

## 2023-03-28 ENCOUNTER — Ambulatory Visit (INDEPENDENT_AMBULATORY_CARE_PROVIDER_SITE_OTHER): Payer: BC Managed Care – PPO

## 2023-03-28 ENCOUNTER — Ambulatory Visit (INDEPENDENT_AMBULATORY_CARE_PROVIDER_SITE_OTHER): Payer: BC Managed Care – PPO | Admitting: Sports Medicine

## 2023-03-28 ENCOUNTER — Other Ambulatory Visit (INDEPENDENT_AMBULATORY_CARE_PROVIDER_SITE_OTHER): Payer: BC Managed Care – PPO

## 2023-03-28 DIAGNOSIS — M79644 Pain in right finger(s): Secondary | ICD-10-CM | POA: Diagnosis not present

## 2023-03-28 DIAGNOSIS — S42254D Nondisplaced fracture of greater tuberosity of right humerus, subsequent encounter for fracture with routine healing: Secondary | ICD-10-CM | POA: Diagnosis not present

## 2023-03-28 DIAGNOSIS — M1612 Unilateral primary osteoarthritis, left hip: Secondary | ICD-10-CM

## 2023-03-28 DIAGNOSIS — S42254A Nondisplaced fracture of greater tuberosity of right humerus, initial encounter for closed fracture: Secondary | ICD-10-CM

## 2023-03-28 DIAGNOSIS — M25541 Pain in joints of right hand: Secondary | ICD-10-CM | POA: Diagnosis not present

## 2023-03-28 MED ORDER — TRIAMCINOLONE ACETONIDE 40 MG/ML IJ SUSP
40.0000 mg | Freq: Once | INTRAMUSCULAR | Status: AC
Start: 2023-03-28 — End: 2023-03-28
  Administered 2023-03-28: 40 mg via INTRAMUSCULAR

## 2023-03-28 NOTE — Assessment & Plan Note (Signed)
Now approximately 5 to 6 weeks status post fall with greater tuberosity fracture right humerus, doing well, she was in a cuff and collar, she is now out of the cuff and collar and I have started her home PT. Return to see me in about 4 to 6 weeks for this.

## 2023-03-28 NOTE — Assessment & Plan Note (Signed)
Pain right thumb interphalangeal joint after a fall, moderate swelling IP joint, adding x-rays, treatment depends on fracture morphology if present.

## 2023-03-28 NOTE — Progress Notes (Signed)
    Procedures performed today:    Procedure: Real-time Ultrasound Guided injection of the left hip joint Device: Samsung HS60  Verbal informed consent obtained.  Time-out conducted.  Noted no overlying erythema, induration, or other signs of local infection.  Skin prepped in a sterile fashion.  Local anesthesia: Topical Ethyl chloride.  With sterile technique and under real time ultrasound guidance: Noted arthritic joint, using a 7 inch spinal needle advanced to the femoral head/neck junction and 1 cc Kenalog 40, 2 cc lidocaine, 2 cc bupivacaine injected easily Completed without difficulty  Advised to call if fevers/chills, erythema, induration, drainage, or persistent bleeding.  Images permanently stored and available for review in PACS.  Impression: Technically successful ultrasound guided injection.  Independent interpretation of notes and tests performed by another provider:   None.  Brief History, Exam, Impression, and Recommendations:    Primary osteoarthritis of left hip Severe hip osteoarthritis on the left, last injection was May 21 of this year, repeat left hip joint injection today, we do need 7 inch spinal needles for these injections.  Thumb pain, right Pain right thumb interphalangeal joint after a fall, moderate swelling IP joint, adding x-rays, treatment depends on fracture morphology if present.  Fracture of greater tuberosity of right humerus Now approximately 5 to 6 weeks status post fall with greater tuberosity fracture right humerus, doing well, she was in a cuff and collar, she is now out of the cuff and collar and I have started her home PT. Return to see me in about 4 to 6 weeks for this.    ____________________________________________ Ihor Austin. Benjamin Stain, M.D., ABFM., CAQSM., AME. Primary Care and Sports Medicine [facility]  Adjunct Professor of Family Medicine  [place] of [facility] [employer] Medicine  [place]

## 2023-03-28 NOTE — Addendum Note (Signed)
Addended by: Carren Rang A on: 03/28/2023 11:32 AM   Modules accepted: Orders

## 2023-03-28 NOTE — Assessment & Plan Note (Signed)
Severe hip osteoarthritis on the left, last injection was May 21 of this year, repeat left hip joint injection today, we do need 7 inch spinal needles for these injections.

## 2023-03-29 MED ORDER — OXYCODONE-ACETAMINOPHEN 10-325 MG PO TABS
1.0000 | ORAL_TABLET | Freq: Three times a day (TID) | ORAL | 0 refills | Status: DC | PRN
Start: 2023-03-29 — End: 2023-05-15

## 2023-04-20 ENCOUNTER — Encounter: Payer: Self-pay | Admitting: Sports Medicine

## 2023-04-20 DIAGNOSIS — S42254D Nondisplaced fracture of greater tuberosity of right humerus, subsequent encounter for fracture with routine healing: Secondary | ICD-10-CM

## 2023-05-02 ENCOUNTER — Ambulatory Visit (INDEPENDENT_AMBULATORY_CARE_PROVIDER_SITE_OTHER): Payer: BC Managed Care – PPO

## 2023-05-02 ENCOUNTER — Ambulatory Visit (INDEPENDENT_AMBULATORY_CARE_PROVIDER_SITE_OTHER): Payer: BC Managed Care – PPO | Admitting: Physician Assistant

## 2023-05-02 ENCOUNTER — Encounter: Payer: Self-pay | Admitting: Physician Assistant

## 2023-05-02 VITALS — HR 110 | Ht 66.0 in | Wt 269.0 lb

## 2023-05-02 DIAGNOSIS — R030 Elevated blood-pressure reading, without diagnosis of hypertension: Secondary | ICD-10-CM | POA: Diagnosis not present

## 2023-05-02 DIAGNOSIS — Z0000 Encounter for general adult medical examination without abnormal findings: Secondary | ICD-10-CM

## 2023-05-02 DIAGNOSIS — Z1231 Encounter for screening mammogram for malignant neoplasm of breast: Secondary | ICD-10-CM | POA: Diagnosis not present

## 2023-05-02 DIAGNOSIS — Z780 Asymptomatic menopausal state: Secondary | ICD-10-CM

## 2023-05-02 DIAGNOSIS — G35 Multiple sclerosis: Secondary | ICD-10-CM

## 2023-05-02 DIAGNOSIS — M47816 Spondylosis without myelopathy or radiculopathy, lumbar region: Secondary | ICD-10-CM

## 2023-05-02 DIAGNOSIS — E66813 Obesity, class 3: Secondary | ICD-10-CM

## 2023-05-02 DIAGNOSIS — Z6841 Body Mass Index (BMI) 40.0 and over, adult: Secondary | ICD-10-CM

## 2023-05-02 DIAGNOSIS — E785 Hyperlipidemia, unspecified: Secondary | ICD-10-CM | POA: Diagnosis not present

## 2023-05-02 DIAGNOSIS — Z131 Encounter for screening for diabetes mellitus: Secondary | ICD-10-CM

## 2023-05-02 NOTE — Progress Notes (Signed)
Complete physical exam  Patient: Sandra Hartman   DOB: 1960-08-16   62 y.o. Female  MRN: [id number]  Subjective:    Chief Complaint  Patient presents with   Annual Exam    Sandra Hartman is a 62 y.o. female who presents today for a complete physical exam. She reports consuming a general diet. The patient does not participate in regular exercise at present. She generally feels poorly. She reports sleeping fairly well. She does not have additional problems to discuss today.    Most recent fall risk assessment:    09/21/2022    8:57 AM  Fall Risk   Falls in the past year? 1  Number falls in past yr: 1  Injury with Fall? 0  Risk for fall due to : History of fall(s)  Follow up Falls evaluation completed     Most recent depression screenings:    09/21/2022    8:58 AM 07/12/2022    8:40 AM  PHQ 2/9 Scores  PHQ - 2 Score 2 2  PHQ- 9 Score  6    Vision:Within last year and Dental: No current dental problems and Receives regular dental care  Patient Active Problem List   Diagnosis Date Noted   Elevated LDL cholesterol level 05/03/2023   Thumb pain, right 03/28/2023   Fracture of greater tuberosity of right humerus 02/23/2023   Left leg swelling 02/23/2023   Chronic bilateral low back pain without sciatica 01/23/2023   Hepatic steatosis 10/03/2022   Aortic atherosclerosis (HCC) 10/03/2022   CAD in native artery 10/03/2022   History of hernia repair 09/21/2022   Left upper quadrant abdominal tenderness without rebound tenderness 09/21/2022   Neuropathy of both feet 07/17/2022   Left shoulder pain 07/12/2021   Lumbar spondylosis 06/08/2020   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 05/24/2020   Ventral hernia without obstruction or gangrene 06/28/2019   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 06/28/2019   Post-menopausal 06/24/2018   Vitamin D deficiency 06/24/2018   Primary osteoarthritis of left hip 10/30/2017   Dyslipidemia (high LDL;  low HDL) 04/05/2017   BMI 38.0-38.9,adult 04/03/2017   Sacroiliac joint dysfunction of both sides 07/23/2015   RLQ abdominal pain 06/02/2015   Complex atypical endometrial hyperplasia 04/12/2015   Colon polyp 12/08/2014   Family history of uterine cancer 12/05/2014   Thickened endometrium 12/05/2014   Bilateral lower abdominal pain 12/05/2014   Epigastric pain 12/05/2014   LUQ abdominal pain 12/05/2014   Obesity 10/21/2014   Squeezing chest pain 09/06/2014   Anxiety state 09/02/2014   Fluttering muscles 09/02/2014   White coat syndrome without diagnosis of hypertension 06/30/2013   MS (multiple sclerosis) (HCC)    Past Medical History:  Diagnosis Date   Degenerative joint disease (DJD) of hip 10/30/2017   MS (multiple sclerosis) (HCC)    Obesity t   Past Surgical History:  Procedure Laterality Date   OVARIAN CYST REMOVAL  1998   Family History  Problem Relation Age of Onset   Cancer Mother    Heart attack Father    Allergies  Allergen Reactions   Sleep Medicine [[place]]       Patient Care Team: Nolene Ebbs as PCP - General (Family Medicine)   Outpatient Medications Prior to Visit  Medication Sig   Calcium Carbonate-Vit D-Min (CALCIUM 600+D PLUS MINERALS) 600-400 MG-UNIT TABS 1 tab p.o. twice daily   cholecalciferol (VITAMIN D) 1000 units tablet Take 2,000 Units by mouth daily.  dexlansoprazole (DEXILANT) 60 MG capsule Take 1 capsule (60 mg total) by mouth daily.   diclofenac (VOLTAREN) 75 MG EC tablet Take 1 tablet (75 mg total) by mouth 2 (two) times daily.   ezetimibe (ZETIA) 10 MG tablet Take by mouth.   Multiple Vitamin (MULTI-VITAMINS) TABS Take by mouth.   oxyCODONE-acetaminophen (PERCOCET) 10-325 MG tablet Take 1 tablet by mouth every 8 (eight) hours as needed for pain.   No facility-administered medications prior to visit.    ROS        Objective:     Pulse (!) 110   Ht 5\' 6"  (1.676 m)   Wt 269 lb (122 kg)   LMP 09/28/2012  (Approximate)   SpO2 98%   BMI 43.42 kg/m  BP Readings from Last 3 Encounters:  02/16/23 (!) 168/105  01/23/23 (!) 161/94  09/21/22 (!) 165/94   Wt Readings from Last 3 Encounters:  05/02/23 269 lb (122 kg)  02/16/23 275 lb 0.6 oz (124.8 kg)  01/23/23 275 lb (124.7 kg)      Physical Exam   Pulse (!) 110   Ht 5\' 6"  (1.676 m)   Wt 269 lb (122 kg)   LMP 09/28/2012 (Approximate)   SpO2 98%   BMI 43.42 kg/m   General Appearance:    Alert, cooperative, obese no distress, appears stated age  Head:    Normocephalic, without obvious abnormality, atraumatic  Eyes:    PERRL, conjunctiva/corneas clear, EOM's intact, fundi    benign, both eyes  Ears:    Normal TM's and external ear canals, both ears  Nose:   Nares normal, septum midline, mucosa normal, no drainage    or sinus tenderness  Throat:   Lips, mucosa, and tongue normal; teeth and gums normal  Neck:   Supple, symmetrical, trachea midline, no adenopathy;    thyroid:  no enlargement/tenderness/nodules; no carotid   bruit or JVD  Back:     Symmetric, no curvature, ROM normal, no CVA tenderness  Lungs:     Clear to auscultation bilaterally, respirations unlabored  Chest Wall:    No tenderness or deformity   Heart:    Regular rate and rhythm, S1 and S2 normal, no murmur, rub   or gallop     Abdomen:     Soft, non-tender, bowel sounds active all four quadrants,    no masses, no organomegaly        Extremities:   Extremities normal, atraumatic, no cyanosis or edema  Pulses:   2+ and symmetric all extremities  Skin:   Skin color, texture, turgor normal, no rashes or lesions  Lymph nodes:   Cervical, supraclavicular, and axillary nodes normal  Neurologic:   CNII-XII intact, normal strength, sensation and reflexes    throughout      Assessment & Plan:    Routine Health Maintenance and Physical Exam  Immunization History  Administered Date(s) Administered   Moderna Sars-Covid-2 Vaccination 12/18/2019, 01/15/2020     Health Maintenance  Topic Date Due   COVID-19 Vaccine (3 - 2023-24 season) 05/18/2023 (Originally 04/01/2023)   HIV Screening  07/13/2023 (Originally 01/09/1976)   Zoster Vaccines- Shingrix (1 of 2) 08/02/2023 (Originally 01/08/2010)   INFLUENZA VACCINE  10/29/2023 (Originally 03/01/2023)   Colonoscopy  12/07/2024   MAMMOGRAM  05/01/2025   Hepatitis C Screening  Completed   HPV VACCINES  Aged Out   DTaP/Tdap/Td  Discontinued    Discussed health benefits of physical activity, and encouraged her to engage in regular exercise appropriate for her  age and condition.  Marland KitchenJasmine December was seen today for annual exam.  Diagnoses and all orders for this visit:  Routine physical examination -     Lipid panel -     CMP14+EGFR -     CBC w/Diff/Platelet -     TSH -     Vitamin D (25 hydroxy)  MS (multiple sclerosis) (HCC)  White coat syndrome without diagnosis of hypertension  Dyslipidemia (high LDL; low HDL) -     Lipid panel  Screening for diabetes mellitus -     CMP14+EGFR  Post-menopausal -     Vitamin D (25 hydroxy)  Class 3 severe obesity due to excess calories without serious comorbidity with body mass index (BMI) of 40.0 to 44.9 in adult (HCC) -     Lipid panel -     CMP14+EGFR -     CBC w/Diff/Platelet -     TSH -     Vitamin D (25 hydroxy)  Lumbar spondylosis   .Marland Kitchen Discussed 150 minutes of exercise a week.  Encouraged vitamin D 1000 units and Calcium 1300mg  or 4 servings of dairy a day.  Due to white coat hypertension would not let her vitals be taken Fasting labs ordered today Colonoscopy due next year Mammogram due next year Pap not indicated Declined all vaccines.        Tandy Gaw, PA-C

## 2023-05-02 NOTE — Patient Instructions (Signed)

## 2023-05-03 ENCOUNTER — Encounter: Payer: Self-pay | Admitting: Physician Assistant

## 2023-05-03 DIAGNOSIS — M25611 Stiffness of right shoulder, not elsewhere classified: Secondary | ICD-10-CM | POA: Diagnosis not present

## 2023-05-03 DIAGNOSIS — E7800 Pure hypercholesterolemia, unspecified: Secondary | ICD-10-CM | POA: Insufficient documentation

## 2023-05-03 DIAGNOSIS — R531 Weakness: Secondary | ICD-10-CM | POA: Diagnosis not present

## 2023-05-03 DIAGNOSIS — M25511 Pain in right shoulder: Secondary | ICD-10-CM | POA: Diagnosis not present

## 2023-05-03 LAB — CMP14+EGFR
ALT: 17 [IU]/L (ref 0–32)
AST: 19 [IU]/L (ref 0–40)
Albumin: 4.4 g/dL (ref 3.9–4.9)
Alkaline Phosphatase: 91 [IU]/L (ref 44–121)
BUN/Creatinine Ratio: 14 (ref 12–28)
BUN: 12 mg/dL (ref 8–27)
Bilirubin Total: 0.5 mg/dL (ref 0.0–1.2)
CO2: 21 mmol/L (ref 20–29)
Calcium: 9.8 mg/dL (ref 8.7–10.3)
Chloride: 101 mmol/L (ref 96–106)
Creatinine, Ser: 0.83 mg/dL (ref 0.57–1.00)
Globulin, Total: 2.5 g/dL (ref 1.5–4.5)
Glucose: 86 mg/dL (ref 70–99)
Potassium: 4.5 mmol/L (ref 3.5–5.2)
Sodium: 141 mmol/L (ref 134–144)
Total Protein: 6.9 g/dL (ref 6.0–8.5)
eGFR: 79 mL/min/{1.73_m2} (ref >59)

## 2023-05-03 LAB — CBC WITH DIFFERENTIAL/PLATELET
Basophils Absolute: 0.1 10*3/uL (ref 0.0–0.2)
Basos: 1 %
EOS (ABSOLUTE): 0.4 10*3/uL (ref 0.0–0.4)
Eos: 4 %
Hematocrit: 44 % (ref 34.0–46.6)
Hemoglobin: 14.9 g/dL (ref 11.1–15.9)
Immature Grans (Abs): 0 10*3/uL (ref 0.0–0.1)
Immature Granulocytes: 0 %
Lymphocytes Absolute: 2.2 10*3/uL (ref 0.7–3.1)
Lymphs: 23 %
MCH: 33 pg (ref 26.6–33.0)
MCHC: 33.9 g/dL (ref 31.5–35.7)
MCV: 97 fL (ref 79–97)
Monocytes Absolute: 0.6 10*3/uL (ref 0.1–0.9)
Monocytes: 6 %
Neutrophils Absolute: 6.5 10*3/uL (ref 1.4–7.0)
Neutrophils: 66 %
Platelets: 304 10*3/uL (ref 150–450)
RBC: 4.52 x10E6/uL (ref 3.77–5.28)
RDW: 11.6 % — ABNORMAL LOW (ref 11.7–15.4)
WBC: 9.9 10*3/uL (ref 3.4–10.8)

## 2023-05-03 LAB — LIPID PANEL
Chol/HDL Ratio: 4.8 {ratio} — ABNORMAL HIGH (ref 0.0–4.4)
Cholesterol, Total: 235 mg/dL — ABNORMAL HIGH (ref 100–199)
HDL: 49 mg/dL (ref >39)
LDL Chol Calc (NIH): 152 mg/dL — ABNORMAL HIGH (ref 0–99)
Triglycerides: 190 mg/dL — ABNORMAL HIGH (ref 0–149)
VLDL Cholesterol Cal: 34 mg/dL (ref 5–40)

## 2023-05-03 LAB — VITAMIN D 25 HYDROXY (VIT D DEFICIENCY, FRACTURES): Vit D, 25-Hydroxy: 38.9 ng/mL (ref 30.0–100.0)

## 2023-05-03 LAB — TSH: TSH: 1.73 u[IU]/mL (ref 0.450–4.500)

## 2023-05-03 NOTE — Progress Notes (Signed)
Sandra Hartman,   Normal hemoglobin and WBC.  Glucose kidney and liver look good.  Thyroid looks great.  Vitamin D not as good as was 1 year ago. Make sure to take with dairy for better absorption.   Cholesterol not to goal.  10 year risk is 10 percent and a statin to lower cholesterol is recommended. Would you consider this?   Marland KitchenMarland KitchenThe 10-year ASCVD risk score (Arnett DK, et al., 2019) is: 10%   Values used to calculate the score:     Age: 62 years     Sex: Female     Is Non-Hispanic African American: No     Diabetic: No     Tobacco smoker: No     Systolic Blood Pressure: 179 mmHg     Is BP treated: No     HDL Cholesterol: 49 mg/dL     Total Cholesterol: 235 mg/dL

## 2023-05-04 ENCOUNTER — Encounter: Payer: Self-pay | Admitting: Physician Assistant

## 2023-05-04 NOTE — Progress Notes (Signed)
Normal mammogram. Follow up in 1 year.

## 2023-05-07 DIAGNOSIS — M25511 Pain in right shoulder: Secondary | ICD-10-CM | POA: Diagnosis not present

## 2023-05-07 DIAGNOSIS — M25611 Stiffness of right shoulder, not elsewhere classified: Secondary | ICD-10-CM | POA: Diagnosis not present

## 2023-05-07 DIAGNOSIS — R531 Weakness: Secondary | ICD-10-CM | POA: Diagnosis not present

## 2023-05-08 DIAGNOSIS — K6389 Other specified diseases of intestine: Secondary | ICD-10-CM | POA: Diagnosis not present

## 2023-05-08 DIAGNOSIS — Z1211 Encounter for screening for malignant neoplasm of colon: Secondary | ICD-10-CM | POA: Diagnosis not present

## 2023-05-08 DIAGNOSIS — R1013 Epigastric pain: Secondary | ICD-10-CM | POA: Diagnosis not present

## 2023-05-08 DIAGNOSIS — Z860101 Personal history of adenomatous and serrated colon polyps: Secondary | ICD-10-CM | POA: Diagnosis not present

## 2023-05-08 DIAGNOSIS — B9681 Helicobacter pylori [H. pylori] as the cause of diseases classified elsewhere: Secondary | ICD-10-CM | POA: Diagnosis not present

## 2023-05-08 DIAGNOSIS — Z09 Encounter for follow-up examination after completed treatment for conditions other than malignant neoplasm: Secondary | ICD-10-CM | POA: Diagnosis not present

## 2023-05-08 DIAGNOSIS — K3189 Other diseases of stomach and duodenum: Secondary | ICD-10-CM | POA: Diagnosis not present

## 2023-05-08 DIAGNOSIS — K2960 Other gastritis without bleeding: Secondary | ICD-10-CM | POA: Diagnosis not present

## 2023-05-08 LAB — HM COLONOSCOPY

## 2023-05-09 DIAGNOSIS — M25611 Stiffness of right shoulder, not elsewhere classified: Secondary | ICD-10-CM | POA: Diagnosis not present

## 2023-05-09 DIAGNOSIS — R531 Weakness: Secondary | ICD-10-CM | POA: Diagnosis not present

## 2023-05-09 DIAGNOSIS — M25511 Pain in right shoulder: Secondary | ICD-10-CM | POA: Diagnosis not present

## 2023-05-10 DIAGNOSIS — M25611 Stiffness of right shoulder, not elsewhere classified: Secondary | ICD-10-CM | POA: Diagnosis not present

## 2023-05-10 DIAGNOSIS — R531 Weakness: Secondary | ICD-10-CM | POA: Diagnosis not present

## 2023-05-10 DIAGNOSIS — M25511 Pain in right shoulder: Secondary | ICD-10-CM | POA: Diagnosis not present

## 2023-05-14 DIAGNOSIS — M25611 Stiffness of right shoulder, not elsewhere classified: Secondary | ICD-10-CM | POA: Diagnosis not present

## 2023-05-14 DIAGNOSIS — M25511 Pain in right shoulder: Secondary | ICD-10-CM | POA: Diagnosis not present

## 2023-05-14 DIAGNOSIS — R531 Weakness: Secondary | ICD-10-CM | POA: Diagnosis not present

## 2023-05-15 ENCOUNTER — Encounter (INDEPENDENT_AMBULATORY_CARE_PROVIDER_SITE_OTHER): Payer: Self-pay | Admitting: Sports Medicine

## 2023-05-15 DIAGNOSIS — M1612 Unilateral primary osteoarthritis, left hip: Secondary | ICD-10-CM

## 2023-05-15 DIAGNOSIS — S42254A Nondisplaced fracture of greater tuberosity of right humerus, initial encounter for closed fracture: Secondary | ICD-10-CM

## 2023-05-15 MED ORDER — OXYCODONE-ACETAMINOPHEN 10-325 MG PO TABS: 1.0000 | ORAL_TABLET | Freq: Three times a day (TID) | ORAL | 0 refills | Status: DC | PRN

## 2023-05-15 NOTE — Telephone Encounter (Signed)

## 2023-05-16 DIAGNOSIS — R531 Weakness: Secondary | ICD-10-CM | POA: Diagnosis not present

## 2023-05-16 DIAGNOSIS — M25511 Pain in right shoulder: Secondary | ICD-10-CM | POA: Diagnosis not present

## 2023-05-16 DIAGNOSIS — M25611 Stiffness of right shoulder, not elsewhere classified: Secondary | ICD-10-CM | POA: Diagnosis not present

## 2023-05-17 DIAGNOSIS — M25511 Pain in right shoulder: Secondary | ICD-10-CM | POA: Diagnosis not present

## 2023-05-17 DIAGNOSIS — M25611 Stiffness of right shoulder, not elsewhere classified: Secondary | ICD-10-CM | POA: Diagnosis not present

## 2023-05-17 DIAGNOSIS — R531 Weakness: Secondary | ICD-10-CM | POA: Diagnosis not present

## 2023-05-17 MED ORDER — OXYCODONE-ACETAMINOPHEN 10-325 MG PO TABS: 1.0000 | ORAL_TABLET | Freq: Three times a day (TID) | ORAL | 0 refills | Status: DC | PRN

## 2023-05-17 NOTE — Addendum Note (Signed)
Addended by: Monica Becton on: 05/17/2023 04:09 PM   Modules accepted: Orders

## 2023-05-17 NOTE — Addendum Note (Signed)
Addended by: Chalmers Cater on: 05/17/2023 01:09 PM   Modules accepted: Orders

## 2023-05-21 ENCOUNTER — Encounter: Payer: Self-pay | Admitting: Physician Assistant

## 2023-05-21 DIAGNOSIS — M25611 Stiffness of right shoulder, not elsewhere classified: Secondary | ICD-10-CM | POA: Diagnosis not present

## 2023-05-21 DIAGNOSIS — M25511 Pain in right shoulder: Secondary | ICD-10-CM | POA: Diagnosis not present

## 2023-05-21 DIAGNOSIS — R531 Weakness: Secondary | ICD-10-CM | POA: Diagnosis not present

## 2023-05-22 MED ORDER — PREDNISONE 50 MG PO TABS
ORAL_TABLET | ORAL | 0 refills | Status: DC
Start: 2023-05-22 — End: 2024-03-19

## 2023-05-23 DIAGNOSIS — M25511 Pain in right shoulder: Secondary | ICD-10-CM | POA: Diagnosis not present

## 2023-05-23 DIAGNOSIS — M25611 Stiffness of right shoulder, not elsewhere classified: Secondary | ICD-10-CM | POA: Diagnosis not present

## 2023-05-23 DIAGNOSIS — R531 Weakness: Secondary | ICD-10-CM | POA: Diagnosis not present

## 2023-05-28 DIAGNOSIS — M25611 Stiffness of right shoulder, not elsewhere classified: Secondary | ICD-10-CM | POA: Diagnosis not present

## 2023-05-28 DIAGNOSIS — M25511 Pain in right shoulder: Secondary | ICD-10-CM | POA: Diagnosis not present

## 2023-05-28 DIAGNOSIS — R531 Weakness: Secondary | ICD-10-CM | POA: Diagnosis not present

## 2023-05-30 DIAGNOSIS — M25611 Stiffness of right shoulder, not elsewhere classified: Secondary | ICD-10-CM | POA: Diagnosis not present

## 2023-05-30 DIAGNOSIS — M25511 Pain in right shoulder: Secondary | ICD-10-CM | POA: Diagnosis not present

## 2023-05-30 DIAGNOSIS — R531 Weakness: Secondary | ICD-10-CM | POA: Diagnosis not present

## 2023-06-04 DIAGNOSIS — L720 Epidermal cyst: Secondary | ICD-10-CM | POA: Diagnosis not present

## 2023-06-04 DIAGNOSIS — M25511 Pain in right shoulder: Secondary | ICD-10-CM | POA: Diagnosis not present

## 2023-06-04 DIAGNOSIS — M25611 Stiffness of right shoulder, not elsewhere classified: Secondary | ICD-10-CM | POA: Diagnosis not present

## 2023-06-04 DIAGNOSIS — R531 Weakness: Secondary | ICD-10-CM | POA: Diagnosis not present

## 2023-06-06 DIAGNOSIS — M25611 Stiffness of right shoulder, not elsewhere classified: Secondary | ICD-10-CM | POA: Diagnosis not present

## 2023-06-06 DIAGNOSIS — R531 Weakness: Secondary | ICD-10-CM | POA: Diagnosis not present

## 2023-06-06 DIAGNOSIS — M25511 Pain in right shoulder: Secondary | ICD-10-CM | POA: Diagnosis not present

## 2023-06-11 DIAGNOSIS — R531 Weakness: Secondary | ICD-10-CM | POA: Diagnosis not present

## 2023-06-11 DIAGNOSIS — M25511 Pain in right shoulder: Secondary | ICD-10-CM | POA: Diagnosis not present

## 2023-06-11 DIAGNOSIS — M25611 Stiffness of right shoulder, not elsewhere classified: Secondary | ICD-10-CM | POA: Diagnosis not present

## 2023-06-18 DIAGNOSIS — M25511 Pain in right shoulder: Secondary | ICD-10-CM | POA: Diagnosis not present

## 2023-06-18 DIAGNOSIS — R531 Weakness: Secondary | ICD-10-CM | POA: Diagnosis not present

## 2023-06-18 DIAGNOSIS — M25611 Stiffness of right shoulder, not elsewhere classified: Secondary | ICD-10-CM | POA: Diagnosis not present

## 2023-06-20 DIAGNOSIS — M25511 Pain in right shoulder: Secondary | ICD-10-CM | POA: Diagnosis not present

## 2023-06-20 DIAGNOSIS — M25611 Stiffness of right shoulder, not elsewhere classified: Secondary | ICD-10-CM | POA: Diagnosis not present

## 2023-06-20 DIAGNOSIS — R531 Weakness: Secondary | ICD-10-CM | POA: Diagnosis not present

## 2023-06-22 ENCOUNTER — Encounter: Payer: Self-pay | Admitting: Physician Assistant

## 2023-06-22 DIAGNOSIS — G35 Multiple sclerosis: Secondary | ICD-10-CM

## 2023-06-26 ENCOUNTER — Encounter: Payer: Self-pay | Admitting: Physician Assistant

## 2023-06-26 ENCOUNTER — Ambulatory Visit (INDEPENDENT_AMBULATORY_CARE_PROVIDER_SITE_OTHER): Payer: BC Managed Care – PPO | Admitting: Physician Assistant

## 2023-06-26 VITALS — BP 137/82 | HR 98 | Temp 98.5°F | Resp 20 | Ht 66.0 in | Wt 264.1 lb

## 2023-06-26 DIAGNOSIS — A048 Other specified bacterial intestinal infections: Secondary | ICD-10-CM

## 2023-06-26 DIAGNOSIS — K5900 Constipation, unspecified: Secondary | ICD-10-CM | POA: Diagnosis not present

## 2023-06-26 DIAGNOSIS — R1013 Epigastric pain: Secondary | ICD-10-CM | POA: Diagnosis not present

## 2023-06-26 DIAGNOSIS — T148XXA Other injury of unspecified body region, initial encounter: Secondary | ICD-10-CM | POA: Diagnosis not present

## 2023-06-26 DIAGNOSIS — M67431 Ganglion, right wrist: Secondary | ICD-10-CM

## 2023-06-26 DIAGNOSIS — R140 Abdominal distension (gaseous): Secondary | ICD-10-CM | POA: Diagnosis not present

## 2023-06-26 NOTE — Progress Notes (Unsigned)
   Acute Office Visit  Subjective:     Patient ID: Sandra Hartman, female    DOB: 05-29-1961, 62 y.o.   MRN: [id number]  No chief complaint on file.   HPI Patient is in today for cyst on right ventral, radial wrist and lower left back lesion. PMH of neck cyst that was drained and then reappeared and she had to get it excised. Wrist cyst appeared a couple months ago, is not painful, and has been increasing in size. Denies wrist and finger ROM limitation. The back lesion appeared Saturday. She scratched the back lesion and it started bleeding. Now it is sore. Denies pruritus of wrist cyst and back lesion.  Patient also reports LUQ pain. She tested positive for H pylori with Gastroenterology. She took abx for 2 wks and felt better until 1 wk after finishing the abx her abdominal pain came back. She has an appt with gastroenterology today.   Review of Systems  Skin:        Wrist cyst and back lesion      Objective:    LMP 09/28/2012 (Approximate)    Physical Exam Skin:    Findings: Lesion present.          Comments: 1 cm spherical, well circumscribed, mobile, firm mass on right ventral radial wrist that is not TTP (red circle). 0.5 cm circular, well circumscribed, erythematous, scabbing, flat lesion on left lower back with no exudate or swelling noted (blue circle).      No results found for any visits on 06/26/23.      Assessment & Plan:   Problem List Items Addressed This Visit   None   No orders of the defined types were placed in this encounter.  Wrist lesion most likely ganglion cyst. Discussed with patient ganglion cyst etiology and patient expressed interest in aspiration.  Aspirated cyst in office today. Provided Coban wrap and advised patient to keep the wrap on until this evening. Recommended compression band/wrap. Explained to patient likelihood of recurrence after aspiration and that the definitive treatment is excision.  No signs of infection for back  lesion. Provided topical antibiotic ointment for back lesion to prevent infection.  Advised patient to follow up about abdominal pain with gastroenterology at appt today. In the meantime, advised patient to avoid spicy and acidic foods.  No follow-ups on file.  AnnaCollin M Mace, Student-PA

## 2023-06-26 NOTE — Patient Instructions (Signed)
Ganglion Cyst  A ganglion cyst is a non-cancerous, fluid-filled lump of tissue that occurs near a joint, tendon, or ligament. The cyst grows out of a joint or the lining of a tendon or ligament. Ganglion cysts most often develop in the hand or wrist, but they can also develop in the shoulder, elbow, hip, knee, ankle, or foot. Ganglion cysts are ball-shaped or egg-shaped. Their size can range from the size of a pea to larger than a grape. Increased activity may cause the cyst to get bigger because more fluid starts to build up. What are the causes? The exact cause of this condition is not known, but it may be related to: Inflammation or irritation around the joint. An injury or tear in the layers of tissue around the joint (joint capsule). Repetitive movements or overuse. History of acute or repeated injury. What increases the risk? You are more likely to develop this condition if: You are a female. You are 41-73 years old. What are the signs or symptoms? The main symptom of this condition is a lump. It most often appears on the hand or wrist. In many cases, there are no other symptoms, but a cyst can sometimes cause: Tingling. Pain or tenderness. Numbness. Weakness or loss of strength in the affected joint. Decreased range of motion in the affected area of the body. How is this diagnosed? Ganglion cysts are usually diagnosed based on a physical exam. Your health care provider will feel the lump and may shine a light next to it. If it is a ganglion cyst, the light will likely shine through it. Your health care provider may order an X-ray, ultrasound, MRI, or CT scan to rule out other conditions. How is this treated? Ganglion cysts often go away on their own without treatment. If you have pain or other symptoms, treatment may be needed. Treatment is also needed if the ganglion cyst limits your movement or if it gets infected. Treatment may include: Wearing a brace or splint on your wrist or  finger. Taking anti-inflammatory medicine. Having fluid drained from the lump with a needle (aspiration). Getting an injection of medicine into the joint to decrease inflammation. This may be corticosteroids, ethanol, or hyaluronidase. Having surgery to remove the ganglion cyst. Placing a pad in your shoe or wearing shoes that will not rub against the cyst if it is on your foot. Follow these instructions at home: Do not press on the ganglion cyst, poke it with a needle, or hit it. Take over-the-counter and prescription medicines only as told by your health care provider. If you have a brace or splint: Wear it as told by your health care provider. Remove it as told by your health care provider. Ask if you need to remove it when you take a shower or a bath. Watch your ganglion cyst for any changes. Keep all follow-up visits as told by your health care provider. This is important. Contact a health care provider if: Your ganglion cyst becomes larger or more painful. You have pus coming from the lump. You have weakness or numbness in the affected area. You have a fever or chills. Get help right away if: You have a fever and have any of these in the cyst area: Increased redness. Red streaks. Swelling. Summary A ganglion cyst is a non-cancerous, fluid-filled lump that occurs near a joint, tendon, or ligament. Ganglion cysts most often develop in the hand or wrist, but they can also develop in the shoulder, elbow, hip, knee, ankle, or  foot. Ganglion cysts often go away on their own without treatment. This information is not intended to replace advice given to you by your health care provider. Make sure you discuss any questions you have with your health care provider. Document Revised: 10/08/2019 Document Reviewed: 10/08/2019 Elsevier Patient Education  2024 [employer].

## 2023-06-27 ENCOUNTER — Encounter: Payer: Self-pay | Admitting: Physician Assistant

## 2023-06-27 DIAGNOSIS — M67431 Ganglion, right wrist: Secondary | ICD-10-CM | POA: Insufficient documentation

## 2023-06-27 DIAGNOSIS — A048 Other specified bacterial intestinal infections: Secondary | ICD-10-CM | POA: Insufficient documentation

## 2023-07-02 DIAGNOSIS — M25611 Stiffness of right shoulder, not elsewhere classified: Secondary | ICD-10-CM | POA: Diagnosis not present

## 2023-07-02 DIAGNOSIS — M25511 Pain in right shoulder: Secondary | ICD-10-CM | POA: Diagnosis not present

## 2023-07-02 DIAGNOSIS — R531 Weakness: Secondary | ICD-10-CM | POA: Diagnosis not present

## 2023-07-04 DIAGNOSIS — I2510 Atherosclerotic heart disease of native coronary artery without angina pectoris: Secondary | ICD-10-CM | POA: Diagnosis not present

## 2023-07-04 DIAGNOSIS — I10 Essential (primary) hypertension: Secondary | ICD-10-CM | POA: Diagnosis not present

## 2023-07-04 DIAGNOSIS — E782 Mixed hyperlipidemia: Secondary | ICD-10-CM | POA: Diagnosis not present

## 2023-07-09 ENCOUNTER — Ambulatory Visit (INDEPENDENT_AMBULATORY_CARE_PROVIDER_SITE_OTHER): Payer: BC Managed Care – PPO | Admitting: Sports Medicine

## 2023-07-09 ENCOUNTER — Encounter: Payer: Self-pay | Admitting: Sports Medicine

## 2023-07-09 ENCOUNTER — Ambulatory Visit: Payer: BC Managed Care – PPO | Admitting: Sports Medicine

## 2023-07-09 ENCOUNTER — Other Ambulatory Visit (INDEPENDENT_AMBULATORY_CARE_PROVIDER_SITE_OTHER): Payer: BC Managed Care – PPO

## 2023-07-09 DIAGNOSIS — R531 Weakness: Secondary | ICD-10-CM | POA: Diagnosis not present

## 2023-07-09 DIAGNOSIS — M1612 Unilateral primary osteoarthritis, left hip: Secondary | ICD-10-CM | POA: Diagnosis not present

## 2023-07-09 DIAGNOSIS — M25511 Pain in right shoulder: Secondary | ICD-10-CM | POA: Diagnosis not present

## 2023-07-09 DIAGNOSIS — M25611 Stiffness of right shoulder, not elsewhere classified: Secondary | ICD-10-CM | POA: Diagnosis not present

## 2023-07-09 MED ORDER — TRIAMCINOLONE ACETONIDE 40 MG/ML IJ SUSP
40.0000 mg | Freq: Once | INTRAMUSCULAR | Status: AC
  Administered 2023-07-09: 40 mg via INTRAMUSCULAR

## 2023-07-09 NOTE — Assessment & Plan Note (Signed)
Very pleasant 62 year old female, known severe hip osteoarthritis on the left, last injection in August of this year, now with recurrence of pain Today we did a repeat injection, we used a 7 inch spinal needle. I think at this point it is reasonable to go and get her consultation for arthroplasty. She would prefer [facility], we we will of course need 1.5 to 3 months between the injection arthroplasty.

## 2023-07-09 NOTE — Addendum Note (Signed)
Addended by: Carren Rang A on: 07/09/2023 02:30 PM   Modules accepted: Orders

## 2023-07-09 NOTE — Progress Notes (Signed)
    Procedures performed today:    Procedure: Real-time Ultrasound Guided injection of the left hip joint Device: Samsung HS60  Verbal informed consent obtained.  Time-out conducted.  Noted no overlying erythema, induration, or other signs of local infection.  Skin prepped in a sterile fashion.  Local anesthesia: Topical Ethyl chloride.  With sterile technique and under real time ultrasound guidance: Noted arthritic joint, using a 7 inch spinal needle advanced to the femoral head/neck junction and 1 cc Kenalog 40, 2 cc lidocaine, 2 cc bupivacaine injected easily Completed without difficulty  Advised to call if fevers/chills, erythema, induration, drainage, or persistent bleeding.  Images permanently stored and available for review in PACS.  Impression: Technically successful ultrasound guided injection.  Independent interpretation of notes and tests performed by another provider:   None.  Brief History, Exam, Impression, and Recommendations:    Primary osteoarthritis of left hip Very pleasant 62 year old female, known severe hip osteoarthritis on the left, last injection in August of this year, now with recurrence of pain Today we did a repeat injection, we used a 7 inch spinal needle. I think at this point it is reasonable to go and get her consultation for arthroplasty. She would prefer [facility], we we will of course need 1.5 to 3 months between the injection arthroplasty.    ____________________________________________ Ihor Austin. Benjamin Stain, M.D., ABFM., CAQSM., AME. Primary Care and Sports Medicine [facility]  Adjunct Professor of Family Medicine  [employer] [facility] [employer]  Restaurant manager, fast food

## 2023-07-10 DIAGNOSIS — G35 Multiple sclerosis: Secondary | ICD-10-CM | POA: Diagnosis not present

## 2023-07-18 ENCOUNTER — Other Ambulatory Visit (INDEPENDENT_AMBULATORY_CARE_PROVIDER_SITE_OTHER): Payer: BC Managed Care – PPO

## 2023-07-18 ENCOUNTER — Ambulatory Visit (INDEPENDENT_AMBULATORY_CARE_PROVIDER_SITE_OTHER): Payer: BC Managed Care – PPO | Admitting: Sports Medicine

## 2023-07-18 DIAGNOSIS — M533 Sacrococcygeal disorders, not elsewhere classified: Secondary | ICD-10-CM

## 2023-07-18 DIAGNOSIS — A048 Other specified bacterial intestinal infections: Secondary | ICD-10-CM | POA: Diagnosis not present

## 2023-07-18 NOTE — Assessment & Plan Note (Addendum)
 Known SI joint pain, last SI joint injection was in May of this year, recurrence of pain, repeat left sacroiliac joint injection. Return to see me as needed.  For insurance coverage purposes she received nearly 100% relief after the initial SI joint injection, due to her recurrence of pain this necessitated a repeat SI joint injection. She had a CT scan in March 2024 that did reveal SI joint degenerative changes.

## 2023-07-18 NOTE — Progress Notes (Addendum)
    Procedures performed today:    Procedure: Real-time Ultrasound Guided injection of the left sacroiliac joint Device: Samsung HS60  Verbal informed consent obtained.  Time-out conducted.  Noted no overlying erythema, induration, or other signs of local infection.  Skin prepped in a sterile fashion.  Local anesthesia: Topical Ethyl chloride.  With sterile technique and under real time ultrasound guidance: Noted minimal degenerative changes, 1 cc Kenalog 40, 2 cc lidocaine, 2 cc bupivacaine injected easily Completed without difficulty  Advised to call if fevers/chills, erythema, induration, drainage, or persistent bleeding.  Images permanently stored and available for review in PACS.  Impression: Technically successful ultrasound guided injection.  Independent interpretation of notes and tests performed by another provider:   None.  Brief History, Exam, Impression, and Recommendations:    Sacroiliac joint dysfunction of both sides Known SI joint pain, last SI joint injection was in May of this year, recurrence of pain, repeat left sacroiliac joint injection. Return to see me as needed.  For insurance coverage purposes she received nearly 100% relief after the initial SI joint injection, due to her recurrence of pain this necessitated a repeat SI joint injection. She had a CT scan in March 2024 that did reveal SI joint degenerative changes.    ____________________________________________ Ihor Austin. Benjamin Stain, M.D., ABFM., CAQSM., AME. Primary Care and Sports Medicine [facility]  Adjunct Professor of Family Medicine  [employer] [facility] [employer] [facility] [employer]  Restaurant manager, fast food

## 2023-07-23 ENCOUNTER — Other Ambulatory Visit: Payer: Self-pay | Admitting: Neurosurgery

## 2023-07-23 DIAGNOSIS — G35 Multiple sclerosis: Secondary | ICD-10-CM

## 2023-07-30 ENCOUNTER — Ambulatory Visit (INDEPENDENT_AMBULATORY_CARE_PROVIDER_SITE_OTHER): Payer: Self-pay

## 2023-07-30 ENCOUNTER — Other Ambulatory Visit: Payer: Self-pay | Admitting: Neurosurgery

## 2023-07-30 DIAGNOSIS — M47814 Spondylosis without myelopathy or radiculopathy, thoracic region: Secondary | ICD-10-CM | POA: Diagnosis not present

## 2023-07-30 DIAGNOSIS — G35 Multiple sclerosis: Secondary | ICD-10-CM | POA: Diagnosis not present

## 2023-07-30 DIAGNOSIS — R9082 White matter disease, unspecified: Secondary | ICD-10-CM | POA: Diagnosis not present

## 2023-07-30 DIAGNOSIS — M4184 Other forms of scoliosis, thoracic region: Secondary | ICD-10-CM | POA: Diagnosis not present

## 2023-07-30 DIAGNOSIS — M5032 Other cervical disc degeneration, mid-cervical region, unspecified level: Secondary | ICD-10-CM | POA: Diagnosis not present

## 2023-07-30 MED ORDER — GADOBUTROL 1 MMOL/ML IV SOLN
10.0000 mL | Freq: Once | INTRAVENOUS | Status: AC | PRN
  Administered 2023-07-30: 10 mL via INTRAVENOUS

## 2023-09-11 ENCOUNTER — Encounter: Payer: Self-pay | Admitting: Sports Medicine

## 2023-10-03 ENCOUNTER — Encounter: Payer: Self-pay | Admitting: Physician Assistant

## 2023-10-10 ENCOUNTER — Encounter: Payer: Self-pay | Admitting: Physician Assistant

## 2023-10-10 ENCOUNTER — Encounter: Payer: Self-pay | Admitting: Sports Medicine

## 2023-10-10 DIAGNOSIS — M1612 Unilateral primary osteoarthritis, left hip: Secondary | ICD-10-CM | POA: Diagnosis not present

## 2023-10-11 NOTE — Telephone Encounter (Signed)
 I have updated my note, to reflect what we think insurance wants, please submit this for appeal.  The patient's MyChart message has some of the information needed

## 2023-10-12 NOTE — Telephone Encounter (Signed)
 Good morning, I do not handle appeals for medication.Thank you

## 2023-10-22 ENCOUNTER — Encounter: Payer: Self-pay | Admitting: Sports Medicine

## 2023-10-22 NOTE — Telephone Encounter (Signed)
 Please help her with mychart, clinical notes are there and they have been since 18 December.  I just do not think she is looking in the right place.  I do not think this needs doctor intervention at this point.

## 2023-10-23 NOTE — Telephone Encounter (Signed)
 Attempted call to patient. Left a voice mail message requesting a return call.

## 2023-10-24 ENCOUNTER — Encounter: Payer: Self-pay | Admitting: Physician Assistant

## 2023-10-24 NOTE — Telephone Encounter (Signed)
 Spoke with patient and placed printed office notes at front desk for patient pick up.

## 2023-10-30 ENCOUNTER — Other Ambulatory Visit (HOSPITAL_COMMUNITY): Payer: Self-pay

## 2023-10-30 ENCOUNTER — Telehealth: Payer: Self-pay | Admitting: Pharmacy Technician

## 2023-10-30 NOTE — Telephone Encounter (Signed)
 PA request has been Started. New Encounter has been or will be created for follow up. For additional info see Pharmacy Prior Auth telephone encounter from 10/30/2023.

## 2023-10-30 NOTE — Telephone Encounter (Signed)
 Pharmacy Patient Advocate Encounter   Received notification from Patient Advice Request messages that prior authorization for Wegovy 0.25MG /0.5ML auto-injectors is required/requested.   Insurance verification completed.   The patient is insured through [employer] .   Per test claim: PA required; PA submitted to above mentioned insurance via CoverMyMeds Key/confirmation #/EOC [id number] Status is pending

## 2023-11-05 NOTE — Telephone Encounter (Signed)
 Pharmacy Patient Advocate Encounter  Received notification from [employer] that Prior Authorization for Wegovy 0.25 has been DENIED.  Full denial letter will be uploaded to the media tab. See denial reason below.   PA #/Case ID/Reference #: [id number]

## 2023-11-06 ENCOUNTER — Other Ambulatory Visit (HOSPITAL_COMMUNITY): Payer: Self-pay

## 2023-11-15 ENCOUNTER — Encounter: Payer: Self-pay | Admitting: Sports Medicine

## 2023-11-15 NOTE — Telephone Encounter (Signed)
 Patient returning Kim's call. Please give her a call back. CB #: [phone].

## 2023-11-15 NOTE — Telephone Encounter (Signed)
 Attempted call to patient to better understand what she is requesting. Left a voice mail message requesting  a return call.

## 2023-11-19 NOTE — Telephone Encounter (Signed)
 Attempted call to patient at given return call # - busy signal x 2 - could not leave a voice mail message.

## 2023-11-21 NOTE — Telephone Encounter (Signed)
 Attempted call to patient. Again left message requesting a return call.

## 2023-11-23 NOTE — Telephone Encounter (Signed)
 Patient is requesting an appeal for an in-office injection procedure. Please contact the appeals department at [phone]. Thanks in advance.

## 2023-12-13 NOTE — Telephone Encounter (Signed)
 Is this something I can help with?

## 2024-02-27 DIAGNOSIS — M1612 Unilateral primary osteoarthritis, left hip: Secondary | ICD-10-CM | POA: Diagnosis not present

## 2024-03-13 ENCOUNTER — Encounter: Payer: Self-pay | Admitting: Physician Assistant

## 2024-03-13 NOTE — Telephone Encounter (Signed)
 Spoke with patient attempting to schedule appt for evaluation of possible UTI due to painful urination She states she is Immobile - due to hip surgery - taking oxycodone  -  She does not know how she would get to office  Asking that we see if Jade could prescribe an abx without a visit in this circumstance? Explained to patient that Vermell is out of the office on Thursday but will be back on Friday 03/14/2024

## 2024-03-14 MED ORDER — SULFAMETHOXAZOLE-TRIMETHOPRIM 800-160 MG PO TABS: 1.0000 | ORAL_TABLET | Freq: Two times a day (BID) | ORAL | 0 refills | Status: DC

## 2024-03-18 DIAGNOSIS — I2510 Atherosclerotic heart disease of native coronary artery without angina pectoris: Secondary | ICD-10-CM | POA: Diagnosis not present

## 2024-03-18 DIAGNOSIS — E669 Obesity, unspecified: Secondary | ICD-10-CM | POA: Diagnosis not present

## 2024-03-18 DIAGNOSIS — Z1331 Encounter for screening for depression: Secondary | ICD-10-CM | POA: Diagnosis not present

## 2024-03-18 DIAGNOSIS — Z01810 Encounter for preprocedural cardiovascular examination: Secondary | ICD-10-CM | POA: Diagnosis not present

## 2024-03-18 DIAGNOSIS — R9439 Abnormal result of other cardiovascular function study: Secondary | ICD-10-CM | POA: Diagnosis not present

## 2024-03-19 ENCOUNTER — Encounter: Payer: Self-pay | Admitting: Physician Assistant

## 2024-03-19 ENCOUNTER — Ambulatory Visit (INDEPENDENT_AMBULATORY_CARE_PROVIDER_SITE_OTHER): Admitting: Physician Assistant

## 2024-03-19 VITALS — BP 150/90 | HR 104 | Temp 97.8°F | Ht 66.0 in | Wt 255.0 lb

## 2024-03-19 DIAGNOSIS — R030 Elevated blood-pressure reading, without diagnosis of hypertension: Secondary | ICD-10-CM

## 2024-03-19 DIAGNOSIS — R300 Dysuria: Secondary | ICD-10-CM | POA: Diagnosis not present

## 2024-03-19 DIAGNOSIS — M1612 Unilateral primary osteoarthritis, left hip: Secondary | ICD-10-CM | POA: Diagnosis not present

## 2024-03-19 DIAGNOSIS — N3001 Acute cystitis with hematuria: Secondary | ICD-10-CM

## 2024-03-19 LAB — POCT URINALYSIS DIP (CLINITEK)
Bilirubin, UA: NEGATIVE
Glucose, UA: NEGATIVE mg/dL
Ketones, POC UA: NEGATIVE mg/dL
Nitrite, UA: NEGATIVE
POC PROTEIN,UA: NEGATIVE
Spec Grav, UA: 1.02 (ref 1.010–1.025)
Urobilinogen, UA: 0.2 U/dL
pH, UA: 6 (ref 5.0–8.0)

## 2024-03-19 MED ORDER — OXYCODONE-ACETAMINOPHEN 10-325 MG PO TABS
1.0000 | ORAL_TABLET | Freq: Three times a day (TID) | ORAL | 0 refills | Status: DC | PRN
Start: 2024-03-19 — End: 2024-06-10

## 2024-03-19 MED ORDER — NITROFURANTOIN MONOHYD MACRO 100 MG PO CAPS: 100.0000 mg | ORAL_CAPSULE | Freq: Two times a day (BID) | ORAL | 0 refills | Status: DC

## 2024-03-19 NOTE — Patient Instructions (Signed)
 Start macrobid  for 7 days.  Will call with culture results Oxycodone  given to use as needed for left hip pain Urinary Tract Infection, Female A urinary tract infection (UTI) is an infection in your urinary tract. The urinary tract is made up of organs that make, store, and get rid of pee (urine) in your body. These organs include: The kidneys. The ureters. The bladder. The urethra. What are the causes? Most UTIs are caused by germs called bacteria. They may be in or near your genitals. These germs grow and cause swelling in your urinary tract. What increases the risk? You're more likely to get a UTI if: You're a female. The urethra is shorter in females than in males. You have a soft tube called a catheter that drains your pee. You can't control when you pee or poop. You have trouble peeing because of: A kidney stone. A urinary blockage. A nerve condition that affects your bladder. Not getting enough to drink. You're sexually active. You use a birth control inside your vagina, like spermicide. You're pregnant. You have low levels of the hormone estrogen in your body. You're an older adult. You're also more likely to get a UTI if you have other health problems. These may include: Diabetes. A weak immune system. Your immune system is your body's defense system. Sickle cell disease. Injury of the spine. What are the signs or symptoms? Symptoms may include: Needing to pee right away. Peeing small amounts often. Pain or burning when you pee. Blood in your pee. Pee that smells bad or odd. Pain in your belly or lower back. You may also: Feel confused. This may be the first symptom in older adults. Vomit. Not feel hungry. Feel tired or easily annoyed. Have a fever or chills. How is this diagnosed? A UTI is diagnosed based on your medical history and an exam. You may also have other tests. These may include: Pee tests. Blood tests. Tests for sexually transmitted infections  (STIs). If you've had more than one UTI, you may need to have imaging studies done to find out why you keep getting them. How is this treated? A UTI can be treated by: Taking antibiotics or other medicines. Drinking enough fluid to keep your pee pale yellow. In rare cases, a UTI can cause a very bad condition called sepsis. Sepsis may be treated in the hospital. Follow these instructions at home: Medicines Take your medicines only as told by your health care provider. If you were given antibiotics, take them as told by your provider. Do not stop taking them even if you start to feel better. General instructions Make sure you: Pee often and fully. Do not hold your pee for a long time. Wipe from front to back after you pee or poop. Use each tissue only once when you wipe. Pee after you have sex. Do not douche or use sprays or powders in your genital area. Contact a health care provider if: Your symptoms don't get better after 1-2 days of taking antibiotics. Your symptoms go away and then come back. You have a fever or chills. You vomit or feel like you may vomit. Get help right away if: You have very bad pain in your back or lower belly. You faint. This information is not intended to replace advice given to you by your health care provider. Make sure you discuss any questions you have with your health care provider. Document Revised: 06/27/2023 Document Reviewed: 10/20/2022 Elsevier Patient Education  2025 [employer].

## 2024-03-19 NOTE — Progress Notes (Unsigned)
   Acute Office Visit  Subjective:     Patient ID: Sandra Hartman, female    DOB: [birth year], 63 y.o.   MRN: [id number]  Chief Complaint  Patient presents with   Dysuria    Urinary frequency ,painful    HPI Patient is in today for   Surgery scheduled for left hip replacem Batrim on 8/14  05/2023  ROS      Objective:    BP (!) 150/90   Pulse (!) 104   Temp 97.8 F (36.6 C) (Oral)   Ht 5' 6 (1.676 m)   Wt 255 lb (115.7 kg)   LMP 09/28/2012 (Approximate)   SpO2 97%   BMI 41.16 kg/m  BP Readings from Last 3 Encounters:  03/19/24 (!) 150/90  06/26/23 137/82  02/16/23 (!) 168/105   Wt Readings from Last 3 Encounters:  03/19/24 255 lb (115.7 kg)  06/26/23 264 lb 1.9 oz (119.8 kg)  05/02/23 269 lb (122 kg)    .SABRA Results for orders placed or performed in visit on 03/19/24  POCT URINALYSIS DIP (CLINITEK)   Collection Time: 03/19/24  1:22 PM  Result Value Ref Range   Color, UA yellow yellow   Clarity, UA clear clear   Glucose, UA negative negative mg/dL   Bilirubin, UA negative negative   Ketones, POC UA negative negative mg/dL   Spec Grav, UA 8.979 8.989 - 1.025   Blood, UA trace-intact (A) negative   pH, UA 6.0 5.0 - 8.0   POC PROTEIN,UA negative negative, trace   Urobilinogen, UA 0.2 0.2 or 1.0 E.U./dL   Nitrite, UA Negative Negative   Leukocytes, UA Trace (A) Negative     Physical Exam       Assessment & Plan:    Vermell Bologna, PA-C

## 2024-03-21 ENCOUNTER — Ambulatory Visit: Payer: Self-pay | Admitting: Physician Assistant

## 2024-03-21 LAB — URINE CULTURE

## 2024-03-21 NOTE — Progress Notes (Signed)
 No bacteria growth in culture to suggest infection. You can stop antibiotic. Waiting for your vaginal swab to return. How are symptoms?

## 2024-03-22 LAB — NUSWAB VAGINITIS PLUS (VG+)
Candida albicans, NAA: NEGATIVE
Candida glabrata, NAA: NEGATIVE
Chlamydia trachomatis, NAA: NEGATIVE
Neisseria gonorrhoeae, NAA: NEGATIVE
Trich vag by NAA: NEGATIVE

## 2024-03-24 NOTE — Progress Notes (Signed)
No BV, yeast.

## 2024-04-01 ENCOUNTER — Encounter: Payer: Self-pay | Admitting: Sports Medicine

## 2024-04-14 DIAGNOSIS — Z01812 Encounter for preprocedural laboratory examination: Secondary | ICD-10-CM | POA: Diagnosis not present

## 2024-04-14 DIAGNOSIS — E785 Hyperlipidemia, unspecified: Secondary | ICD-10-CM | POA: Diagnosis not present

## 2024-04-14 DIAGNOSIS — G35 Multiple sclerosis: Secondary | ICD-10-CM | POA: Diagnosis not present

## 2024-04-14 DIAGNOSIS — I10 Essential (primary) hypertension: Secondary | ICD-10-CM | POA: Diagnosis not present

## 2024-04-14 DIAGNOSIS — M1612 Unilateral primary osteoarthritis, left hip: Secondary | ICD-10-CM | POA: Diagnosis not present

## 2024-04-14 DIAGNOSIS — Z01818 Encounter for other preprocedural examination: Secondary | ICD-10-CM | POA: Diagnosis not present

## 2024-04-14 DIAGNOSIS — I2510 Atherosclerotic heart disease of native coronary artery without angina pectoris: Secondary | ICD-10-CM | POA: Diagnosis not present

## 2024-04-14 DIAGNOSIS — Z6841 Body Mass Index (BMI) 40.0 and over, adult: Secondary | ICD-10-CM | POA: Diagnosis not present

## 2024-04-22 DIAGNOSIS — M1612 Unilateral primary osteoarthritis, left hip: Secondary | ICD-10-CM | POA: Diagnosis not present

## 2024-04-28 DIAGNOSIS — M1612 Unilateral primary osteoarthritis, left hip: Secondary | ICD-10-CM | POA: Diagnosis not present

## 2024-04-29 DIAGNOSIS — M1612 Unilateral primary osteoarthritis, left hip: Secondary | ICD-10-CM | POA: Diagnosis not present

## 2024-04-30 DIAGNOSIS — R32 Unspecified urinary incontinence: Secondary | ICD-10-CM | POA: Diagnosis not present

## 2024-04-30 DIAGNOSIS — Z96642 Presence of left artificial hip joint: Secondary | ICD-10-CM | POA: Diagnosis not present

## 2024-04-30 DIAGNOSIS — E785 Hyperlipidemia, unspecified: Secondary | ICD-10-CM | POA: Diagnosis not present

## 2024-04-30 DIAGNOSIS — Z602 Problems related to living alone: Secondary | ICD-10-CM | POA: Diagnosis not present

## 2024-04-30 DIAGNOSIS — Z7982 Long term (current) use of aspirin: Secondary | ICD-10-CM | POA: Diagnosis not present

## 2024-04-30 DIAGNOSIS — Z471 Aftercare following joint replacement surgery: Secondary | ICD-10-CM | POA: Diagnosis not present

## 2024-05-02 DIAGNOSIS — Z471 Aftercare following joint replacement surgery: Secondary | ICD-10-CM | POA: Diagnosis not present

## 2024-05-02 DIAGNOSIS — E785 Hyperlipidemia, unspecified: Secondary | ICD-10-CM | POA: Diagnosis not present

## 2024-05-02 DIAGNOSIS — Z602 Problems related to living alone: Secondary | ICD-10-CM | POA: Diagnosis not present

## 2024-05-02 DIAGNOSIS — Z96642 Presence of left artificial hip joint: Secondary | ICD-10-CM | POA: Diagnosis not present

## 2024-05-02 DIAGNOSIS — R32 Unspecified urinary incontinence: Secondary | ICD-10-CM | POA: Diagnosis not present

## 2024-05-02 DIAGNOSIS — Z7982 Long term (current) use of aspirin: Secondary | ICD-10-CM | POA: Diagnosis not present

## 2024-05-05 DIAGNOSIS — Z96642 Presence of left artificial hip joint: Secondary | ICD-10-CM | POA: Diagnosis not present

## 2024-05-05 DIAGNOSIS — Z602 Problems related to living alone: Secondary | ICD-10-CM | POA: Diagnosis not present

## 2024-05-05 DIAGNOSIS — E785 Hyperlipidemia, unspecified: Secondary | ICD-10-CM | POA: Diagnosis not present

## 2024-05-05 DIAGNOSIS — Z7982 Long term (current) use of aspirin: Secondary | ICD-10-CM | POA: Diagnosis not present

## 2024-05-05 DIAGNOSIS — R32 Unspecified urinary incontinence: Secondary | ICD-10-CM | POA: Diagnosis not present

## 2024-05-05 DIAGNOSIS — Z471 Aftercare following joint replacement surgery: Secondary | ICD-10-CM | POA: Diagnosis not present

## 2024-05-07 DIAGNOSIS — Z96642 Presence of left artificial hip joint: Secondary | ICD-10-CM | POA: Diagnosis not present

## 2024-05-07 DIAGNOSIS — Z7982 Long term (current) use of aspirin: Secondary | ICD-10-CM | POA: Diagnosis not present

## 2024-05-07 DIAGNOSIS — R32 Unspecified urinary incontinence: Secondary | ICD-10-CM | POA: Diagnosis not present

## 2024-05-07 DIAGNOSIS — E785 Hyperlipidemia, unspecified: Secondary | ICD-10-CM | POA: Diagnosis not present

## 2024-05-07 DIAGNOSIS — Z471 Aftercare following joint replacement surgery: Secondary | ICD-10-CM | POA: Diagnosis not present

## 2024-05-07 DIAGNOSIS — Z602 Problems related to living alone: Secondary | ICD-10-CM | POA: Diagnosis not present

## 2024-05-09 DIAGNOSIS — Z96642 Presence of left artificial hip joint: Secondary | ICD-10-CM | POA: Diagnosis not present

## 2024-05-09 DIAGNOSIS — R32 Unspecified urinary incontinence: Secondary | ICD-10-CM | POA: Diagnosis not present

## 2024-05-09 DIAGNOSIS — Z602 Problems related to living alone: Secondary | ICD-10-CM | POA: Diagnosis not present

## 2024-05-09 DIAGNOSIS — E785 Hyperlipidemia, unspecified: Secondary | ICD-10-CM | POA: Diagnosis not present

## 2024-05-09 DIAGNOSIS — G35D Multiple sclerosis, unspecified: Secondary | ICD-10-CM | POA: Diagnosis not present

## 2024-05-09 DIAGNOSIS — Z471 Aftercare following joint replacement surgery: Secondary | ICD-10-CM | POA: Diagnosis not present

## 2024-05-09 DIAGNOSIS — Z7982 Long term (current) use of aspirin: Secondary | ICD-10-CM | POA: Diagnosis not present

## 2024-05-12 DIAGNOSIS — Z96642 Presence of left artificial hip joint: Secondary | ICD-10-CM | POA: Diagnosis not present

## 2024-05-12 DIAGNOSIS — R32 Unspecified urinary incontinence: Secondary | ICD-10-CM | POA: Diagnosis not present

## 2024-05-12 DIAGNOSIS — G35D Multiple sclerosis, unspecified: Secondary | ICD-10-CM | POA: Diagnosis not present

## 2024-05-12 DIAGNOSIS — E785 Hyperlipidemia, unspecified: Secondary | ICD-10-CM | POA: Diagnosis not present

## 2024-05-12 DIAGNOSIS — Z7982 Long term (current) use of aspirin: Secondary | ICD-10-CM | POA: Diagnosis not present

## 2024-05-12 DIAGNOSIS — Z602 Problems related to living alone: Secondary | ICD-10-CM | POA: Diagnosis not present

## 2024-05-12 DIAGNOSIS — Z471 Aftercare following joint replacement surgery: Secondary | ICD-10-CM | POA: Diagnosis not present

## 2024-05-14 DIAGNOSIS — Z471 Aftercare following joint replacement surgery: Secondary | ICD-10-CM | POA: Diagnosis not present

## 2024-05-14 DIAGNOSIS — R32 Unspecified urinary incontinence: Secondary | ICD-10-CM | POA: Diagnosis not present

## 2024-05-14 DIAGNOSIS — E785 Hyperlipidemia, unspecified: Secondary | ICD-10-CM | POA: Diagnosis not present

## 2024-05-14 DIAGNOSIS — G35D Multiple sclerosis, unspecified: Secondary | ICD-10-CM | POA: Diagnosis not present

## 2024-05-14 DIAGNOSIS — Z602 Problems related to living alone: Secondary | ICD-10-CM | POA: Diagnosis not present

## 2024-05-14 DIAGNOSIS — Z96642 Presence of left artificial hip joint: Secondary | ICD-10-CM | POA: Diagnosis not present

## 2024-05-14 DIAGNOSIS — Z7982 Long term (current) use of aspirin: Secondary | ICD-10-CM | POA: Diagnosis not present

## 2024-05-15 ENCOUNTER — Other Ambulatory Visit: Payer: Self-pay | Admitting: Sports Medicine

## 2024-05-15 DIAGNOSIS — M1612 Unilateral primary osteoarthritis, left hip: Secondary | ICD-10-CM | POA: Diagnosis not present

## 2024-05-15 DIAGNOSIS — M7989 Other specified soft tissue disorders: Secondary | ICD-10-CM

## 2024-05-16 ENCOUNTER — Ambulatory Visit (INDEPENDENT_AMBULATORY_CARE_PROVIDER_SITE_OTHER)

## 2024-05-16 DIAGNOSIS — M7989 Other specified soft tissue disorders: Secondary | ICD-10-CM

## 2024-05-20 DIAGNOSIS — M1612 Unilateral primary osteoarthritis, left hip: Secondary | ICD-10-CM | POA: Diagnosis not present

## 2024-05-22 DIAGNOSIS — M25552 Pain in left hip: Secondary | ICD-10-CM | POA: Diagnosis not present

## 2024-05-27 DIAGNOSIS — M25552 Pain in left hip: Secondary | ICD-10-CM | POA: Diagnosis not present

## 2024-05-29 DIAGNOSIS — M25552 Pain in left hip: Secondary | ICD-10-CM | POA: Diagnosis not present

## 2024-06-03 DIAGNOSIS — M25552 Pain in left hip: Secondary | ICD-10-CM | POA: Diagnosis not present

## 2024-06-05 DIAGNOSIS — M25552 Pain in left hip: Secondary | ICD-10-CM | POA: Diagnosis not present

## 2024-06-10 ENCOUNTER — Ambulatory Visit (INDEPENDENT_AMBULATORY_CARE_PROVIDER_SITE_OTHER): Admitting: Physician Assistant

## 2024-06-10 VITALS — BP 122/90 | HR 100 | Ht 66.0 in | Wt 257.0 lb

## 2024-06-10 DIAGNOSIS — E785 Hyperlipidemia, unspecified: Secondary | ICD-10-CM

## 2024-06-10 DIAGNOSIS — R030 Elevated blood-pressure reading, without diagnosis of hypertension: Secondary | ICD-10-CM | POA: Diagnosis not present

## 2024-06-10 DIAGNOSIS — M25552 Pain in left hip: Secondary | ICD-10-CM | POA: Diagnosis not present

## 2024-06-10 DIAGNOSIS — M545 Low back pain, unspecified: Secondary | ICD-10-CM

## 2024-06-10 DIAGNOSIS — G35D Multiple sclerosis, unspecified: Secondary | ICD-10-CM

## 2024-06-10 DIAGNOSIS — M47816 Spondylosis without myelopathy or radiculopathy, lumbar region: Secondary | ICD-10-CM

## 2024-06-10 DIAGNOSIS — M533 Sacrococcygeal disorders, not elsewhere classified: Secondary | ICD-10-CM

## 2024-06-10 DIAGNOSIS — Z0000 Encounter for general adult medical examination without abnormal findings: Secondary | ICD-10-CM | POA: Diagnosis not present

## 2024-06-10 DIAGNOSIS — G8929 Other chronic pain: Secondary | ICD-10-CM

## 2024-06-10 DIAGNOSIS — Z96642 Presence of left artificial hip joint: Secondary | ICD-10-CM

## 2024-06-10 MED ORDER — EZETIMIBE 10 MG PO TABS: 10.0000 mg | ORAL_TABLET | Freq: Every day | ORAL | 3 refills | Status: AC

## 2024-06-10 NOTE — Patient Instructions (Signed)

## 2024-06-11 ENCOUNTER — Encounter: Payer: Self-pay | Admitting: Physician Assistant

## 2024-06-11 ENCOUNTER — Ambulatory Visit: Payer: Self-pay | Admitting: Physician Assistant

## 2024-06-11 DIAGNOSIS — Z96642 Presence of left artificial hip joint: Secondary | ICD-10-CM | POA: Insufficient documentation

## 2024-06-11 LAB — CMP14+EGFR
ALT: 10 IU/L (ref 0–32)
AST: 15 IU/L (ref 0–40)
Albumin: 4.4 g/dL (ref 3.9–4.9)
Alkaline Phosphatase: 116 IU/L (ref 49–135)
BUN/Creatinine Ratio: 14 (ref 12–28)
BUN: 12 mg/dL (ref 8–27)
Bilirubin Total: 0.5 mg/dL (ref 0.0–1.2)
CO2: 21 mmol/L (ref 20–29)
Calcium: 9.7 mg/dL (ref 8.7–10.3)
Chloride: 103 mmol/L (ref 96–106)
Creatinine, Ser: 0.87 mg/dL (ref 0.57–1.00)
Globulin, Total: 2.6 g/dL (ref 1.5–4.5)
Glucose: 82 mg/dL (ref 70–99)
Potassium: 4.4 mmol/L (ref 3.5–5.2)
Sodium: 142 mmol/L (ref 134–144)
Total Protein: 7 g/dL (ref 6.0–8.5)
eGFR: 74 mL/min/1.73 (ref >59)

## 2024-06-11 LAB — CBC WITH DIFFERENTIAL/PLATELET
Basophils Absolute: 0.1 x10E3/uL (ref 0.0–0.2)
Basos: 1 %
EOS (ABSOLUTE): 0.4 x10E3/uL (ref 0.0–0.4)
Eos: 5 %
Hematocrit: 40.2 % (ref 34.0–46.6)
Hemoglobin: 13.2 g/dL (ref 11.1–15.9)
Immature Grans (Abs): 0 x10E3/uL (ref 0.0–0.1)
Immature Granulocytes: 0 %
Lymphocytes Absolute: 1.7 x10E3/uL (ref 0.7–3.1)
Lymphs: 22 %
MCH: 31.9 pg (ref 26.6–33.0)
MCHC: 32.8 g/dL (ref 31.5–35.7)
MCV: 97 fL (ref 79–97)
Monocytes Absolute: 0.4 x10E3/uL (ref 0.1–0.9)
Monocytes: 5 %
Neutrophils Absolute: 5 x10E3/uL (ref 1.4–7.0)
Neutrophils: 67 %
Platelets: 308 x10E3/uL (ref 150–450)
RBC: 4.14 x10E6/uL (ref 3.77–5.28)
RDW: 13.4 % (ref 11.7–15.4)
WBC: 7.6 x10E3/uL (ref 3.4–10.8)

## 2024-06-11 LAB — LIPID PANEL
Chol/HDL Ratio: 4.3 ratio (ref 0.0–4.4)
Cholesterol, Total: 225 mg/dL — ABNORMAL HIGH (ref 100–199)
HDL: 52 mg/dL (ref >39)
LDL Chol Calc (NIH): 146 mg/dL — ABNORMAL HIGH (ref 0–99)
Triglycerides: 153 mg/dL — ABNORMAL HIGH (ref 0–149)
VLDL Cholesterol Cal: 27 mg/dL (ref 5–40)

## 2024-06-11 LAB — TSH: TSH: 2.14 u[IU]/mL (ref 0.450–4.500)

## 2024-06-11 NOTE — Progress Notes (Signed)
 Complete physical exam  Patient: Sandra Hartman   DOB: 05-Aug-1960   63 y.o. Female  MRN: [id number]  Subjective:    Chief Complaint  Patient presents with   Annual Exam   .Discussed the use of AI scribe software for clinical note transcription with the patient, who gave verbal consent to proceed.  History of Present Illness Sandra Hartman is a 63 year old female who presents to the clinic for CPE.   Lower back pain and s/p left hip replacement - Lower back pain bilaterally, radiating into the buttocks with hx of SI joint inflammation and pain - Onset following left hip replacement performed last year - Initially attributed pain to muscle strain and began physical therapy - Mobility improved significantly after hip replacement; transitioned from walker to cane - Severe hip pain prior to surgery limited mobility to nighttime bathroom trips - Postoperative swelling in legs, predominantly right leg, resolved - Uses oxycodone  as needed for pain management - Poor sleep, worsened since onset of back pain  Mobility and physical activity - Ambulates with a cane for support - Active within apartment and hallways - Participates in physical therapy  Blood pressure monitoring - Blood pressure typically elevated at clinic visits - Home blood pressure averages 125/80 mmHg when relaxed - Regular home blood pressure monitoring  Psychological symptoms - No anxiety or depression - Expresses irritability rather than depressive symptoms  Respiratory and cardiovascular symptoms - No chest pain - No shortness of breath  Gastrointestinal symptoms - Regular bowel movements     Most recent fall risk assessment:    06/10/2024   11:35 AM  Fall Risk   Falls in the past year? 1  Number falls in past yr: 0  Injury with Fall? 1     Most recent depression screenings:    06/10/2024   11:35 AM 09/21/2022    8:58 AM  PHQ 2/9 Scores  PHQ - 2 Score 0 2  PHQ- 9 Score 2      Vision:Within last year and Dental: No current dental problems and Receives regular dental care  Patient Active Problem List   Diagnosis Date Noted   S/P hip replacement, left 06/11/2024   Morbid obesity (HCC) 06/11/2024   H. pylori infection 06/27/2023   Ganglion cyst of wrist, right 06/27/2023   Elevated LDL cholesterol level 05/03/2023   Thumb pain, right 03/28/2023   Fracture of greater tuberosity of right humerus 02/23/2023   Left leg swelling 02/23/2023   Chronic bilateral low back pain without sciatica 01/23/2023   Hepatic steatosis 10/03/2022   Aortic atherosclerosis 10/03/2022   CAD in native artery 10/03/2022   History of hernia repair 09/21/2022   Left upper quadrant abdominal tenderness without rebound tenderness 09/21/2022   Neuropathy of both feet 07/17/2022   Left shoulder pain 07/12/2021   Lumbar spondylosis 06/08/2020   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 05/24/2020   Ventral hernia without obstruction or gangrene 06/28/2019   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 06/28/2019   Post-menopausal 06/24/2018   Vitamin D  deficiency 06/24/2018   Primary osteoarthritis of left hip 10/30/2017   Dyslipidemia (high LDL; low HDL) 04/05/2017   BMI 38.0-38.9,adult 04/03/2017   Sacroiliac joint dysfunction of both sides 07/23/2015   RLQ abdominal pain 06/02/2015   Complex atypical endometrial hyperplasia 04/12/2015   Colon polyp 12/08/2014   Family history of uterine cancer 12/05/2014   Thickened endometrium 12/05/2014   Bilateral lower abdominal pain 12/05/2014   Epigastric  pain 12/05/2014   LUQ abdominal pain 12/05/2014   Obesity 10/21/2014   Squeezing chest pain 09/06/2014   Anxiety state 09/02/2014   Fluttering muscles 09/02/2014   White coat syndrome without diagnosis of hypertension 06/30/2013   MS (multiple sclerosis)    Past Medical History:  Diagnosis Date   Degenerative joint disease (DJD) of hip 10/30/2017   MS  (multiple sclerosis)    Neuromuscular disorder (HCC)    Obesity 10/30/2017   Past Surgical History:  Procedure Laterality Date   ABDOMINAL HYSTERECTOMY     HERNIA REPAIR     OVARIAN CYST REMOVAL  07/31/1996   Family History  Problem Relation Age of Onset   Cancer Mother    Heart attack Father    Heart disease Father    Allergies  Allergen Reactions   Sleep Medicine [[place]]       Patient Care Team: Bryonna Sundby L, PA-C as PCP - General (Family Medicine)   Outpatient Medications Prior to Visit  Medication Sig   Calcium  Carbonate-Vit D-Min (CALCIUM  600+D PLUS MINERALS) 600-400 MG-UNIT TABS 1 tab p.o. twice daily   cholecalciferol (VITAMIN D ) 1000 units tablet Take 2,000 Units by mouth daily.   Multiple Vitamin (MULTI-VITAMINS) TABS Take by mouth.   [DISCONTINUED] ezetimibe (ZETIA) 10 MG tablet Take by mouth.   [DISCONTINUED] nitrofurantoin , macrocrystal-monohydrate, (MACROBID ) 100 MG capsule Take 1 capsule (100 mg total) by mouth 2 (two) times daily.   [DISCONTINUED] oxyCODONE -acetaminophen  (PERCOCET) 10-325 MG tablet Take 1 tablet by mouth every 8 (eight) hours as needed for pain.   No facility-administered medications prior to visit.    ROS See HPI.     Objective:     BP (!) 122/90   Pulse 100   Ht 5' 6 (1.676 m)   Wt 257 lb (116.6 kg)   LMP 09/28/2012 (Approximate)   SpO2 99%   BMI 41.48 kg/m  BP Readings from Last 3 Encounters:  06/10/24 (!) 122/90  03/19/24 (!) 150/90  06/26/23 137/82   Wt Readings from Last 3 Encounters:  06/10/24 257 lb (116.6 kg)  03/19/24 255 lb (115.7 kg)  06/26/23 264 lb 1.9 oz (119.8 kg)      Physical Exam  BP (!) 122/90   Pulse 100   Ht 5' 6 (1.676 m)   Wt 257 lb (116.6 kg)   LMP 09/28/2012 (Approximate)   SpO2 99%   BMI 41.48 kg/m   General Appearance:    Alert, cooperative, obese no distress, appears stated age  Head:    Normocephalic, without obvious abnormality, atraumatic  Eyes:    PERRL,  conjunctiva/corneas clear, EOM's intact, fundi    benign, both eyes  Ears:    Normal TM's and external ear canals, both ears  Nose:   Nares normal, septum midline, mucosa normal, no drainage    or sinus tenderness  Throat:   Lips, mucosa, and tongue normal; teeth and gums normal  Neck:   Supple, symmetrical, trachea midline, no adenopathy;    thyroid :  no enlargement/tenderness/nodules; no carotid   bruit or JVD  Back:     Symmetric, no curvature, ROM normal, no CVA tenderness. No tenderness to palpation over lumbar spine. Tight and tender lumbar paraspinal muscles.   Lungs:     Clear to auscultation bilaterally, respirations unlabored  Chest Wall:    No tenderness or deformity   Heart:    Regular rate and rhythm, S1 and S2 normal, no murmur, rub   or gallop     Abdomen:  Soft, non-tender, bowel sounds active all four quadrants,    no masses, no organomegaly        Extremities:   Extremities normal, atraumatic, no cyanosis or edema  Pulses:   2+ and symmetric all extremities  Skin:   Skin color, texture, turgor normal, no rashes or lesions             Assessment & Plan:    Routine Health Maintenance and Physical Exam  Immunization History  Administered Date(s) Administered   Moderna Sars-Covid-2 Vaccination 12/18/2019, 01/15/2020    Health Maintenance  Topic Date Due   COVID-19 Vaccine (3 - 2025-26 season) 06/26/2024 (Originally 03/31/2024)   Zoster Vaccines- Shingrix (1 of 2) 09/10/2024 (Originally 01/08/2010)   Influenza Vaccine  10/28/2024 (Originally 02/29/2024)   Pneumococcal Vaccine: 50+ Years (1 of 2 - PCV) 06/10/2025 (Originally 01/09/1979)   HIV Screening  06/10/2025 (Originally 01/09/1975)   Mammogram  05/01/2025   Colonoscopy  05/07/2033   Hepatitis C Screening  Completed   Hepatitis B Vaccines 19-59 Average Risk  Aged Out   HPV VACCINES  Aged Out   Meningococcal B Vaccine  Aged Out   DTaP/Tdap/Td  Discontinued    Discussed health benefits of physical  activity, and encouraged her to engage in regular exercise appropriate for her age and condition.  SABRACraige was seen today for annual exam.  Diagnoses and all orders for this visit:  Routine physical examination -     CBC with Differential/Platelet -     CMP14+EGFR -     Lipid panel -     TSH  MS (multiple sclerosis) -     CMP14+EGFR  White coat syndrome without diagnosis of hypertension -     CMP14+EGFR  Sacroiliac joint dysfunction of both sides -     Ambulatory referral to Pain Clinic  Dyslipidemia (high LDL; low HDL) -     CMP14+EGFR -     Lipid panel -     ezetimibe (ZETIA) 10 MG tablet; Take 1 tablet (10 mg total) by mouth daily.  S/P hip replacement, left  Morbid obesity (HCC)  Lumbar spondylosis -     Ambulatory referral to Pain Clinic  Chronic bilateral low back pain without sciatica -     Ambulatory referral to Pain Clinic    Assessment & Plan Adult Wellness Visit Routine wellness visit. Mammogram and colonoscopy are up to date. Blood pressure is well-controlled with at home readings.  - Continue Zetia, vitamin D , and calcium . - Ordered cholesterol labs with all CPE screening labs.  - Encouraged regular physical activity and walking without cane as tolerated. - Patient declines all vaccines.   Low back pain, likely sacroiliac joint related Chronic low back pain, likely sacroiliac joint related, with radiation to buttocks, more pronounced on the right. Awaiting surgeon's approval for SI joint injections. - Referred to Dr. Rosella at [facility] for evaluation and management. - Continue physical therapy. - Await surgeon's approval for SI joint injections.  Obesity, morbid - certainly weight is impacting pain on joints and spine - Encouraged water exercise to reduce impact - Encouraged high protein and low carb diet.  - Declined any help with medications.   Hyperlipidemia Managed with Zetia. Due for cholesterol labs to monitor lipid levels. - Ordered  cholesterol labs. - Continue Zetia.  Hypertension Well-controlled with home blood pressure readings around 125/80 mmHg. - Continue current management and monitor blood pressure at home.    Return in about 1 year (around 06/10/2025).  Witney Huie, PA-C

## 2024-06-11 NOTE — Progress Notes (Signed)
 Naylin,   Glucose, kidney, liver look good.  Thyroid  normal.  LDL improved some. TG improved some. HDL looks great.   10 year cardiovascular risk is 5 percent. Continue on zetia only for now. Continue to work on low fatty/processed/fried diet.   SABRA.The 10-year ASCVD risk score (Arnett DK, et al., 2019) is: 5%   Values used to calculate the score:     Age: 63 years     Clincally relevant sex: Female     Is Non-Hispanic African American: No     Diabetic: No     Tobacco smoker: No     Systolic Blood Pressure: 122 mmHg     Is BP treated: No     HDL Cholesterol: 52 mg/dL     Total Cholesterol: 225 mg/dL

## 2024-06-13 DIAGNOSIS — M25552 Pain in left hip: Secondary | ICD-10-CM | POA: Diagnosis not present

## 2024-06-16 ENCOUNTER — Encounter: Payer: Self-pay | Admitting: Physician Assistant

## 2024-06-18 DIAGNOSIS — M47816 Spondylosis without myelopathy or radiculopathy, lumbar region: Secondary | ICD-10-CM | POA: Diagnosis not present

## 2024-06-18 DIAGNOSIS — G894 Chronic pain syndrome: Secondary | ICD-10-CM | POA: Diagnosis not present

## 2024-06-18 DIAGNOSIS — M461 Sacroiliitis, not elsewhere classified: Secondary | ICD-10-CM | POA: Diagnosis not present

## 2024-06-30 ENCOUNTER — Other Ambulatory Visit: Payer: Self-pay | Admitting: Physician Assistant

## 2024-06-30 DIAGNOSIS — Z1231 Encounter for screening mammogram for malignant neoplasm of breast: Secondary | ICD-10-CM

## 2024-07-10 DIAGNOSIS — M461 Sacroiliitis, not elsewhere classified: Secondary | ICD-10-CM | POA: Diagnosis not present

## 2024-07-16 ENCOUNTER — Ambulatory Visit (INDEPENDENT_AMBULATORY_CARE_PROVIDER_SITE_OTHER)

## 2024-07-16 ENCOUNTER — Encounter: Admitting: Physician Assistant

## 2024-07-16 DIAGNOSIS — Z1231 Encounter for screening mammogram for malignant neoplasm of breast: Secondary | ICD-10-CM

## 2024-07-21 ENCOUNTER — Ambulatory Visit: Payer: Self-pay | Admitting: Physician Assistant

## 2024-07-21 NOTE — Progress Notes (Signed)
Rosmary,   Normal mammogram. Follow up in 1 year.

## 2024-08-05 ENCOUNTER — Encounter: Payer: Self-pay | Admitting: Physician Assistant

## 2024-08-08 MED ORDER — AMBULATORY NON FORMULARY MEDICATION: 0 refills | Status: AC
